# Patient Record
Sex: Male | Born: 2000
Health system: Southern US, Community
[De-identification: ages and names within clinical notes are randomized; demographics above are authoritative.]

## PROBLEM LIST (undated history)

## (undated) DIAGNOSIS — F909 Attention-deficit hyperactivity disorder, unspecified type: Secondary | ICD-10-CM

## (undated) HISTORY — PX: VARICOCELE EXCISION: SUR582

---
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
## 2019-06-07 ENCOUNTER — Encounter (INDEPENDENT_AMBULATORY_CARE_PROVIDER_SITE_OTHER): Payer: Self-pay

## 2019-06-07 ENCOUNTER — Other Ambulatory Visit: Payer: Self-pay

## 2019-06-07 ENCOUNTER — Ambulatory Visit (INDEPENDENT_AMBULATORY_CARE_PROVIDER_SITE_OTHER): Payer: Commercial Managed Care - PPO | Admitting: Family Medicine

## 2019-06-07 ENCOUNTER — Encounter: Payer: Self-pay | Admitting: Family Medicine

## 2019-06-07 DIAGNOSIS — R0789 Other chest pain: Secondary | ICD-10-CM

## 2019-06-07 DIAGNOSIS — R011 Cardiac murmur, unspecified: Secondary | ICD-10-CM

## 2019-06-07 DIAGNOSIS — F900 Attention-deficit hyperactivity disorder, predominantly inattentive type: Secondary | ICD-10-CM | POA: Diagnosis not present

## 2019-06-07 NOTE — Patient Instructions (Addendum)
Let me know if the chest pain returns or gets worse  Also let me know if you decide you would like testing for Sexually Transmitted Infections  Preventive Care for [place], Male The transition to life after high school as a young adult can be a stressful time with many changes. You may start seeing a primary care physician instead of a pediatrician. This is the time when your health care becomes your responsibility. Preventive care refers to lifestyle choices and visits with your health care provider that can promote health and wellness. What does preventive care include?  A yearly physical exam. This is also called an annual wellness visit.  Dental exams once or twice a year.  Routine eye exams. Ask your health care provider how often you should have your eyes checked.  Personal lifestyle choices, including: ? Daily care of your teeth and gums. ? Regular physical activity. ? Eating a healthy diet. ? Avoiding tobacco and drug use. ? Avoiding or limiting alcohol use. ? Practicing safe sex. What happens during an annual wellness visit? Preventive care starts with a yearly visit to your primary care physician. The services and screenings done by your health care provider during your annual wellness visit will depend on your overall health, lifestyle risk factors, and family history of disease. Counseling Your health care provider may ask you questions about:  Past medical problems and your family's medical history.  Medicines or supplements that you take.  Health insurance and access to health care.  Alcohol, tobacco, and drug use, including use of any bodybuilding drugs (anabolic steroids).  Your safety at home, work, or school.  Access to firearms.  Emotional well-being and how you cope with stress.  Relationship well-being.  Diet, exercise, and sleep habits.  Your sexual health and activity. Screening You may have the following tests or measurements:  Height,  weight, and BMI.  Blood pressure.  Lipid and cholesterol levels.  Tuberculosis skin test.  Skin exam.  Vision and hearing tests.  Genital exam to check for testicular cancer or hernias.  Screening test for hepatitis.  Screening tests for STDs (sexually transmitted diseases), if you are at risk. Vaccines Your health care provider may recommend certain vaccines, such as:  Influenza vaccine. This is recommended every year.  Tetanus, diphtheria, and acellular pertussis (Tdap, Td) vaccine. You may need a Td booster every 10 years.  Varicella vaccine. You may need this if you have not been vaccinated.  HPV vaccine. If you are 30 or younger, you may need three doses over 6 months.  Measles, mumps, and rubella (MMR) vaccine. You may need at least one dose of MMR. You may also need a second dose.  Pneumococcal 13-valent conjugate (PCV13) vaccine. You may need this if you have certain conditions and have not been vaccinated.  Pneumococcal polysaccharide (PPSV23) vaccine. You may need one or two doses if you smoke cigarettes or if you have certain conditions.  Meningococcal vaccine. One dose is recommended if you are age 28-21 years and a first-year college student living in a residence hall, or if you have one of several medical conditions. You may also need additional booster doses.  Hepatitis A vaccine. You may need this if you have certain conditions or if you travel or work in places where you may be exposed to hepatitis A.  Hepatitis B vaccine. You may need this if you have certain conditions or if you travel or work in places where you may be exposed to hepatitis B.  Haemophilus influenzae type b (Hib) vaccine. You may need this if you have certain risk factors. Talk to your health care provider about which screenings and vaccines you need and how often you need them. What steps can I take to develop healthy behaviors?      Have regular preventive health care visits with  your primary care physician and dentist.  Eat a healthy diet.  Drink enough fluid to keep your urine clear or pale yellow.  Stay active. Exercise at least 30 minutes 5 or more days of the week.  Use alcohol responsibly.  Maintain a healthy weight.  Do not use any products that contain nicotine, such as cigarettes, chewing tobacco, and e-cigarettes. If you need help quitting, ask your health care provider.  Do not use drugs.  Practice safe sex. This includes using condoms to prevent STDs or an unwanted pregnancy.  Find healthy ways to manage stress. How can I protect myself from injury? Injuries from violence or accidents are the leading cause of death among young adults and can often be prevented. Take these steps to help protect yourself:  Always wear your seat belt while driving or riding in a vehicle.  Do not drive if you have been drinking alcohol. Do not ride with someone who has been drinking.  Do not drive when you are tired or distracted. Do not text while driving.  Wear a helmet and other protective equipment during sports activities.  If you have firearms in your house, make sure you follow all gun safety procedures.  Seek help if you have been bullied, physically abused, or sexually abused.  Avoid fighting.  Use the Internet responsibly to avoid dangers such as online bullying. What can I do to cope with stress? Young adults may face many new challenges that can be stressful, such as finding a job, going to college, moving away from home, managing money, being in a relationship, getting married, and having children. To manage stress:  Avoid known stressful situations when you can.  Exercise regularly.  Find a stress-reducing activity that works best for you. Examples include meditation, yoga, listening to music, or reading.  Spend time in nature.  Keep a journal to write about your stress and how you respond.  Talk to your health care provider about  stress. He or she may suggest counseling.  Spend time with supportive friends or family.  Do not cope with stress by: ? Drinking alcohol or using drugs. ? Smoking cigarettes. ? Eating. Where can I get more information? Learn more about preventive care and healthy habits from:  U.S. Preventive Services Task Force: StageSync.si  National Adolescent and Mikes: StrategicRoad.nl  American Academy of Pediatrics Bright Futures: https://brightfutures.MemberVerification.co.za  Society for Adolescent Health and Medicine: MoralBlog.co.za.aspx  PodExchange.nl: ToyLending.fr This information is not intended to replace advice given to you by your health care provider. Make sure you discuss any questions you have with your health care provider. Document Released: 04/15/2016 Document Revised: 07/12/2017 Document Reviewed: 04/15/2016 Elsevier Interactive Patient Education  2019 [employer].

## 2019-06-07 NOTE — Progress Notes (Signed)
Annual Exam   Chief Complaint:  Chief Complaint  Patient presents with  . Establish Care    previous PCP with [facility]  . Chest Pain    happened on 06/04/2019 had mid chest pain, felt like he was getting punched. In the mornings for 2 days.    History of Present Illness:  Jay Webb is a 18 y.o. presents today for annual examination.     Chest Pain  This is a new problem. The current episode started in the past 7 days. The onset quality is sudden. The problem occurs daily. The problem has been resolved. The pain is present in the substernal region. The pain is at a severity of 7/10. The quality of the pain is described as pressure. The pain does not radiate. Pertinent negatives include no abdominal pain, back pain, cough, diaphoresis, dizziness, exertional chest pressure, fever, headaches, irregular heartbeat, leg pain, malaise/fatigue, nausea, near-syncope, palpitations, shortness of breath, vomiting or weakness. Associated with: cold. Treatments tried: deep breathing. The treatment provided moderate relief. Risk factors include male gender.  His past medical history is significant for anxiety/panic attacks.  His family medical history is significant for hypertension.   Lasted for several hours - would get worse when he went into cold climate No episodes today    Social History   Tobacco Use  Smoking Status Never Smoker  Smokeless Tobacco Never Used   Social History   Substance and Sexual Activity  Alcohol Use Never  . Frequency: Never   Social History   Substance and Sexual Activity  Drug Use Not Currently     General Health Dentist in the last year: Yes Eye doctor: no  Weight Wt Readings from Last 3 Encounters:  06/07/19 174 lb 12 oz (79.3 kg) (80 %, Z= 0.85)*  08/28/18 184 lb 4.9 oz (83.6 kg) (89 %, Z= 1.23)*  05/27/15 134 lb 7.7 oz (61 kg) (73 %, Z= 0.63)*   * Growth percentiles are based on CDC (Boys, 2-20 Years) data.   Patient has normal  BMI  BMI Readings from Last 1 Encounters:  06/07/19 24.37 kg/m (74 %, Z= 0.64)*   * Growth percentiles are based on CDC (Boys, 2-20 Years) data.     Chronic disease screening Blood pressure monitoring:  BP Readings from Last 3 Encounters:  06/07/19 128/80  08/28/18 118/78  05/28/15 130/65    Lipid Monitoring: Indication for screening: age >35, obesity, diabetes, family hx, CV risk factors.  Lipid screening: Not Indicated  Lab Results  Component Value Date   CHOL 143 12/30/2017   HDL 37 12/30/2017   [place] 95 12/30/2017   TRIG 56 12/30/2017     Diabetes Screening: age >73, overweight, family hx, PCOS, hx of gestational diabetes, at risk ethnicity, elevated blood pressure >135/80.  Diabetes Screening screening: Not Indicated  No results found for: HGBA1C   Immunization History  Administered Date(s) Administered  . DTaP 01/09/2001, 03/29/2001, 08/30/2001, 02/14/2002, 02/24/2006  . Hepatitis A 01/02/2009, 11/24/2010  . Hepatitis B 11/17/2000, 12/22/2000, 08/29/2001  . HiB (PRP-OMP) 01/09/2001, 03/29/2001, 08/30/2001, 02/14/2002  . Hpv 12/30/2017, 03/01/2018  . IPV 01/09/2001, 03/29/2001, 02/14/2002, 02/24/2006  . MMR 11/06/2001, 02/24/2006  . Meningococcal B Recombinant 12/30/2017, 02/01/2018  . Meningococcal Conjugate 02/15/2014  . Meningococcal Mcv4o 11/16/2016  . Tdap 11/29/2011  . Varicella 11/06/2001, 09/03/2015    Past Medical History:  Diagnosis Date  . ADHD 2014    Past Surgical History:  Procedure Laterality Date  . VARICOCELE EXCISION  Prior to Admission medications   Not on File    Allergies  Allergen Reactions  . Other Itching    Seasonal, cats     Social History   Socioeconomic History  . Marital status: Single    Spouse name: Not on file  . Number of children: Not on file  . Years of education: High school  . Highest education level: Not on file  Occupational History  . Not on file  Social Needs  . Financial resource  strain: Not hard at all  . Food insecurity    Worry: Not on file    Inability: Not on file  . Transportation needs    Medical: Not on file    Non-medical: Not on file  Tobacco Use  . Smoking status: Never Smoker  . Smokeless tobacco: Never Used  Substance and Sexual Activity  . Alcohol use: Never    Frequency: Never  . Drug use: Not Currently  . Sexual activity: Not Currently  Lifestyle  . Physical activity    Days per week: Not on file    Minutes per session: Not on file  . Stress: Not on file  Relationships  . Social Herbalist on phone: Not on file    Gets together: Not on file    Attends religious service: Not on file    Active member of club or organization: Not on file    Attends meetings of clubs or organizations: Not on file    Relationship status: Not on file  . Intimate partner violence    Fear of current or ex partner: Not on file    Emotionally abused: Not on file    Physically abused: Not on file    Forced sexual activity: Not on file  Other Topics Concern  . Not on file  Social History Narrative   06/07/19   From: [place], moved her when he was 18 years old   Living: with Dad   Work: works in [place]      Family: close with family, sister lives with mom      Enjoys: any sport - soccer and basketball, spending time with friends, working      Exercise: running and working out at home   Diet: fast food breakfast, eats fruits/veggies, regular dinner      Safety   Seat belts: Yes    Guns: No   Safe in relationships: Yes     Family History  Problem Relation Age of Onset  . Arthritis Mother   . Hypertension Mother   . Thyroid disease Mother   . Stroke Maternal Grandfather 68    Review of Systems  Constitutional: Negative for diaphoresis, fever and malaise/fatigue.  HENT: Negative for congestion and sinus pain.   Eyes: Negative for blurred vision and double vision.  Respiratory: Negative for cough and shortness of breath.    Cardiovascular: Positive for chest pain. Negative for palpitations and near-syncope.  Gastrointestinal: Negative for abdominal pain, nausea and vomiting.  Genitourinary: Negative for dysuria.  Musculoskeletal: Negative for back pain.  Skin: Negative for rash.  Neurological: Negative for dizziness, weakness and headaches.  Endo/Heme/Allergies: Negative for environmental allergies.  Psychiatric/Behavioral: Negative for depression. The patient is not nervous/anxious.      Physical Exam BP 128/80   Pulse 72   Temp 98 F (36.7 C)   Ht 5' 11" (1.803 m)   Wt 174 lb 12 oz (79.3 kg)   SpO2 96%  BMI 24.37 kg/m    BP Readings from Last 3 Encounters:  06/07/19 128/80  08/28/18 118/78  05/28/15 130/65      Physical Exam Constitutional:      General: He is not in acute distress.    Appearance: He is well-developed. He is not diaphoretic.  HENT:     Head: Normocephalic and atraumatic.     Right Ear: Tympanic membrane and ear canal normal.     Left Ear: Tympanic membrane and ear canal normal.     Nose: Nose normal.     Mouth/Throat:     Pharynx: Uvula midline.  Eyes:     General: No scleral icterus.    Conjunctiva/sclera: Conjunctivae normal.     Pupils: Pupils are equal, round, and reactive to light.  Neck:     Musculoskeletal: Normal range of motion and neck supple.  Cardiovascular:     Rate and Rhythm: Normal rate and regular rhythm.     Heart sounds: Normal heart sounds. No murmur.     Comments: No murmur with valsva Pulmonary:     Effort: Pulmonary effort is normal. No respiratory distress.     Breath sounds: Normal breath sounds. No wheezing.  Abdominal:     General: Bowel sounds are normal. There is no distension.     Palpations: Abdomen is soft. There is no mass.     Tenderness: There is no abdominal tenderness. There is no guarding.  Musculoskeletal: Normal range of motion.  Lymphadenopathy:     Cervical: No cervical adenopathy.  Skin:    General: Skin is warm  and dry.     Capillary Refill: Capillary refill takes less than 2 seconds.  Neurological:     Mental Status: He is alert and oriented to person, place, and time.        Results: AUDIT Questionnaire (screen for alcoholism): does not drink  PHQ-9:  low risk     Assessment: 18 y.o. here for routine annual physical examination.  Plan: Problem List Items Addressed This Visit      Other   ADHD (attention deficit hyperactivity disorder), inattentive type    No longer needing medication.       Heart murmur   Atypical chest pain    Normal exam and symptoms have resolved. Unclear what caused the symptoms - given improvement with deep breathing wonder about some anxiety component but pt denied symptoms. Consider cardiac monitoring if returning or persisting to r/o cardiac cause - but no exertional and improved with activity makes this less likely         Screening: -- Blood pressure screen normal -- cholesterol screening: not due for screening -- Weight screening: normal -- Diabetes Screening: not due for screening -- Nutrition: normal    Psych -- Depression screening (PHQ-9): low risk  Safety -- tobacco screening: not using -- alcohol screening: low-risk usage. -- no evidence of domestic violence or intimate partner violence. -- STD screening: gonorrhea/chlamydia NAAT not collected per patient request. -----he will contact if he decides to pursue STI testing   Cancer Screening -- No age related cancer screening due  Immunizations -- flu vaccine up to date -- TDAP q10 years up to date      Lesleigh Noe

## 2019-06-07 NOTE — Assessment & Plan Note (Signed)
Normal exam and symptoms have resolved. Unclear what caused the symptoms - given improvement with deep breathing wonder about some anxiety component but pt denied symptoms. Consider cardiac monitoring if returning or persisting to r/o cardiac cause - but no exertional and improved with activity makes this less likely

## 2019-06-07 NOTE — Assessment & Plan Note (Signed)
No longer needing medication.

## 2019-08-06 ENCOUNTER — Other Ambulatory Visit: Payer: Self-pay

## 2019-08-06 ENCOUNTER — Ambulatory Visit: Payer: Commercial Managed Care - PPO | Admitting: Family Medicine

## 2019-08-06 ENCOUNTER — Encounter: Payer: Self-pay | Admitting: Family Medicine

## 2019-08-06 VITALS — BP 108/62 | HR 68 | Temp 98.1°F | Wt 181.0 lb

## 2019-08-06 DIAGNOSIS — Z113 Encounter for screening for infections with a predominantly sexual mode of transmission: Secondary | ICD-10-CM

## 2019-08-06 NOTE — Progress Notes (Signed)
   Subjective:    Patient ID: Jay Webb, male    DOB: 06-14-2001, 18 y.o.   MRN: [id number]  HPI Chief Complaint  Patient presents with  . STD Screening    Pt requesting STD testing/labs   This is an 18 yo male who requests labs for std testing. Denies concerns, has had 5 lifetime partners, heterosexual. Uses condoms some of the time. He denies dysuria, hematuria, testicular pain or swelling. He notes that had has had some bumps on his penis. Had similar bumps on his arms that resolved. Not blisters. Not painful, a little itching. ? Heat rash.   Past Medical History:  Diagnosis Date  . ADHD 2014   Past Surgical History:  Procedure Laterality Date  . VARICOCELE EXCISION     Family History  Problem Relation Age of Onset  . Arthritis Mother   . Hypertension Mother   . Thyroid disease Mother   . Stroke Maternal Grandfather 68   Social History   Tobacco Use  . Smoking status: Never Smoker  . Smokeless tobacco: Never Used  Substance Use Topics  . Alcohol use: Never    Frequency: Never  . Drug use: Not Currently      Review of Systems Per HPI    Objective:   Physical Exam Vitals signs reviewed.  Constitutional:      General: He is not in acute distress.    Appearance: Normal appearance. He is normal weight. He is not ill-appearing, toxic-appearing or diaphoretic.  HENT:     Head: Normocephalic and atraumatic.  Eyes:     Conjunctiva/sclera: Conjunctivae normal.  Neck:     Musculoskeletal: Normal range of motion and neck supple.  Cardiovascular:     Rate and Rhythm: Normal rate.  Pulmonary:     Effort: Pulmonary effort is normal.  Genitourinary:    Penis: Normal.      Scrotum/Testes: Normal.     Comments: No skin lesions observed.  Skin:    General: Skin is warm and dry.  Neurological:     Mental Status: He is alert and oriented to person, place, and time.  Psychiatric:        Mood and Affect: Mood normal.        Behavior: Behavior normal.      Thought Content: Thought content normal.        Judgment: Judgment normal.       BP 108/62 (BP Location: Left Arm, Patient Position: Sitting, Cuff Size: Normal)   Pulse 68   Temp 98.1 F (36.7 C) (Temporal)   Wt 181 lb (82.1 kg)   SpO2 98%   BMI 25.24 kg/m  Wt Readings from Last 3 Encounters:  08/06/19 181 lb (82.1 kg) (84 %, Z= 1.01)*  06/07/19 174 lb 12 oz (79.3 kg) (80 %, Z= 0.85)*  08/28/18 184 lb 4.9 oz (83.6 kg) (89 %, Z= 1.23)*   * Growth percentiles are based on CDC (Boys, 2-20 Years) data.       Assessment & Plan:  1. Screening for STD (sexually transmitted disease) - encouraged him to always practice safe sex - C. trachomatis/N. gonorrhoeae RNA - RPR - HIV Antibody (routine testing w rflx)   Clarene Reamer, FNP-BC  [facility]  08/08/2019 8:06 AM

## 2019-08-06 NOTE — Patient Instructions (Signed)
Good to see you today  Please stop at the lab  Use condoms every time!!

## 2019-08-07 LAB — C. TRACHOMATIS/N. GONORRHOEAE RNA
C. trachomatis RNA, TMA: NOT DETECTED
N. gonorrhoeae RNA, TMA: NOT DETECTED

## 2019-08-07 LAB — RPR: RPR Ser Ql: NONREACTIVE

## 2019-08-07 LAB — HIV ANTIBODY (ROUTINE TESTING W REFLEX): HIV 1&2 Ab, 4th Generation: NONREACTIVE

## 2019-08-08 ENCOUNTER — Encounter: Payer: Self-pay | Admitting: Family Medicine

## 2019-08-09 ENCOUNTER — Telehealth: Payer: Self-pay | Admitting: Family Medicine

## 2019-08-09 NOTE — Telephone Encounter (Signed)
Patient is returning your call in regards to his lab results.  C/B # [phone]

## 2019-08-09 NOTE — Telephone Encounter (Signed)
See result note.  

## 2019-09-03 ENCOUNTER — Other Ambulatory Visit: Payer: Self-pay

## 2019-09-03 ENCOUNTER — Telehealth: Payer: Self-pay | Admitting: Family Medicine

## 2019-09-03 DIAGNOSIS — Z20822 Contact with and (suspected) exposure to covid-19: Secondary | ICD-10-CM

## 2019-09-03 NOTE — Telephone Encounter (Signed)
Left message for patient to call back  

## 2019-09-03 NOTE — Telephone Encounter (Signed)
Noted. Please call patient and see if he would like a virtual visit.

## 2019-09-03 NOTE — Telephone Encounter (Signed)
Order is already in [place] placed by [facility] nurse pool.

## 2019-09-03 NOTE — Telephone Encounter (Signed)
Pt wanted to wait for test results

## 2019-09-03 NOTE — Telephone Encounter (Signed)
Patient called and stated that he has be sick and not feeling well. He is wanting to be COVID tested and will be going to [facility] site to be tested.

## 2019-09-05 LAB — NOVEL CORONAVIRUS, NAA: SARS-CoV-2, NAA: NOT DETECTED

## 2019-09-06 ENCOUNTER — Telehealth: Payer: Self-pay | Admitting: Family Medicine

## 2019-09-06 NOTE — Telephone Encounter (Signed)
I will see him then  Cc to [facility]

## 2019-09-06 NOTE — Telephone Encounter (Signed)
Best number [phone] Pt called stating he tested for covid on 9/21 and received results  neg 9/23.  He needs note to go back to work  Pt was out of work 9/16 to 9/21  Returned to on 9/22

## 2019-09-06 NOTE — Telephone Encounter (Signed)
Patient advised. Patient has a cough so virtual visit is set up with Dr Glori Bickers.

## 2019-09-06 NOTE — Telephone Encounter (Signed)
Please review. Patient did not have a visit for this.

## 2019-09-06 NOTE — Telephone Encounter (Signed)
Please notify the patient that we need additional information and will need to meet with him for the work note.  We can meet virtually if that is more convenient.

## 2019-09-07 ENCOUNTER — Encounter: Payer: Self-pay | Admitting: Family Medicine

## 2019-09-07 ENCOUNTER — Ambulatory Visit (INDEPENDENT_AMBULATORY_CARE_PROVIDER_SITE_OTHER): Payer: Commercial Managed Care - PPO | Admitting: Family Medicine

## 2019-09-07 DIAGNOSIS — B9789 Other viral agents as the cause of diseases classified elsewhere: Secondary | ICD-10-CM | POA: Diagnosis not present

## 2019-09-07 DIAGNOSIS — J069 Acute upper respiratory infection, unspecified: Secondary | ICD-10-CM

## 2019-09-07 NOTE — Patient Instructions (Signed)
So glad you are feeling better.  You can return to work without restriction tomorrow. I sent you the note via mychart but call us if you need anything else. If symptoms return or new ones develop please let me know

## 2019-09-07 NOTE — Progress Notes (Signed)
Virtual Visit via Video Note  I connected with Lamar Benes on 09/07/19 at 10:00 AM EDT by a video enabled telemedicine application and verified that I am speaking with the correct person using two identifiers.  Location: Patient: home Provider: office    I discussed the limitations of evaluation and management by telemedicine and the availability of in person appointments. The patient expressed understanding and agreed to proceed.  History of Present Illness: Pt presents for f/u of illness  Last Wednesday he got really sick and had to be out of work  Was out for 4 days  Had a low grade fever (100.5) with uri symptoms   Tested for corona virus on Monday  Negative   To get back to work - needs note  Works at [employer] better - most symptoms are gone  Cough persists- mild / not productive  No wheeze or sob  No nasal symptoms or ear/throat pain  Taste and smell nl  No GI symptoms   Usually works m-sat   Patient Active Problem List   Diagnosis Date Noted  . Viral URI with cough 09/07/2019  . ADHD (attention deficit hyperactivity disorder), inattentive type 06/07/2019  . Heart murmur 06/07/2019  . Atypical chest pain 06/07/2019   Past Medical History:  Diagnosis Date  . ADHD 2014   Past Surgical History:  Procedure Laterality Date  . VARICOCELE EXCISION     Social History   Tobacco Use  . Smoking status: Never Smoker  . Smokeless tobacco: Never Used  Substance Use Topics  . Alcohol use: Not Currently    Frequency: Never  . Drug use: Not Currently   Family History  Problem Relation Age of Onset  . Arthritis Mother   . Hypertension Mother   . Thyroid disease Mother   . Stroke Maternal Grandfather 68   Allergies  Allergen Reactions  . Other Itching    Seasonal, cats   No current outpatient medications on file prior to visit.   No current facility-administered medications on file prior to visit.    Review of Systems  Constitutional: Negative  for chills, fever and malaise/fatigue.  HENT: Negative for congestion, ear pain, sinus pain and sore throat.   Eyes: Negative for blurred vision, pain, discharge and redness.  Respiratory: Positive for cough.        Mild residual cough  Cardiovascular: Negative for chest pain and palpitations.  Gastrointestinal: Negative for abdominal pain, diarrhea, nausea and vomiting.  Skin: Negative for rash.  Neurological: Negative for dizziness and headaches.      Observations/Objective: Patient appears well, in no distress Weight is baseline  No facial swelling or asymmetry Normal voice-not hoarse and no slurred speech No obvious tremor or mobility impairment Moving neck and UEs normally Able to hear the call well  No cough or shortness of breath during interview  Talkative and mentally sharp with no cognitive changes No skin changes on face or neck , no rash or pallor Affect is normal    Assessment and Plan: Problem List Items Addressed This Visit      Respiratory   Viral URI with cough    Much improved with neg corona virus test on Monday Note written to return to work tomorrow inst to alert [place] if symptoms return or new ones develop          Follow Up Instructions: So glad you are feeling better.  You can return to work without restriction tomorrow. I sent you the note  via mychart but call us if you need anything else. If symptoms return or new ones develop please let me know    I discussed the assessment and treatment plan with the patient. The patient was provided an opportunity to ask questions and all were answered. The patient agreed with the plan and demonstrated an understanding of the instructions.   The patient was advised to call back or seek an in-person evaluation if the symptoms worsen or if the condition fails to improve as anticipated.     Loura Pardon, MD

## 2019-09-08 NOTE — Assessment & Plan Note (Signed)
Much improved with neg corona virus test on Monday Note written to return to work tomorrow inst to alert [place] if symptoms return or new ones develop

## 2019-09-10 ENCOUNTER — Ambulatory Visit: Payer: Commercial Managed Care - PPO | Admitting: Family Medicine

## 2019-10-04 ENCOUNTER — Telehealth: Payer: Self-pay | Admitting: Radiology

## 2019-10-04 NOTE — Telephone Encounter (Signed)
Called the patient to confirm address and phone number for [place] lab. LVM

## 2020-03-28 ENCOUNTER — Other Ambulatory Visit: Payer: Self-pay

## 2020-03-28 ENCOUNTER — Encounter: Payer: Self-pay | Admitting: Emergency Medicine

## 2020-03-28 ENCOUNTER — Emergency Department: Payer: Commercial Managed Care - PPO

## 2020-03-28 ENCOUNTER — Telehealth: Payer: Self-pay

## 2020-03-28 ENCOUNTER — Emergency Department
Admission: EM | Admit: 2020-03-28 | Discharge: 2020-03-28 | Disposition: A | Discharge status: Home / Self Care | Payer: Commercial Managed Care - PPO | Attending: Emergency Medicine | Admitting: Emergency Medicine

## 2020-03-28 DIAGNOSIS — R002 Palpitations: Secondary | ICD-10-CM | POA: Diagnosis present

## 2020-03-28 LAB — CBC
HCT: 44.3 % (ref 39.0–52.0)
Hemoglobin: 15.6 g/dL (ref 13.0–17.0)
MCH: 31.4 pg (ref 26.0–34.0)
MCHC: 35.2 g/dL (ref 30.0–36.0)
MCV: 89.1 fL (ref 80.0–100.0)
Platelets: 316 10*3/uL (ref 150–400)
RBC: 4.97 MIL/uL (ref 4.22–5.81)
RDW: 11.9 % (ref 11.5–15.5)
WBC: 6.8 10*3/uL (ref 4.0–10.5)
nRBC: 0 % (ref 0.0–0.2)

## 2020-03-28 LAB — BASIC METABOLIC PANEL
Anion gap: 9 (ref 5–15)
BUN: 12 mg/dL (ref 6–20)
CO2: 24 mmol/L (ref 22–32)
Calcium: 9.6 mg/dL (ref 8.9–10.3)
Chloride: 105 mmol/L (ref 98–111)
Creatinine, Ser: 0.69 mg/dL (ref 0.61–1.24)
GFR calc Af Amer: >60 mL/min (ref >60)
GFR calc non Af Amer: >60 mL/min (ref >60)
Glucose, Bld: 103 mg/dL — ABNORMAL HIGH (ref 70–99)
Potassium: 4 mmol/L (ref 3.5–5.1)
Sodium: 138 mmol/L (ref 135–145)

## 2020-03-28 MED ORDER — SODIUM CHLORIDE 0.9% FLUSH: 3.0000 mL | Freq: Once | INTRAVENOUS | Status: DC

## 2020-03-28 NOTE — Telephone Encounter (Signed)
Agree with plan for evaluation

## 2020-03-28 NOTE — Telephone Encounter (Signed)
pts mom and pt on phone; earlier this morning pt had heart flutters which is continuing; heart racing; not sure heart rate but pt said heart was beating fast like he was working out. Pt was lightheaded, felt weak and felt like was going to pass out; pt did not lose consciousness but pt felt like someone was holding him down. Pt still having heart flutters but since resting other symptoms have subsided; pt had similar episode few wks ago but symptoms today were more intense. pts mom will take pt to [facility] ED now for eval and possible testing. FYI to Dr Selena Batten.

## 2020-03-28 NOTE — ED Notes (Signed)
EKG repeated per MD request, VS updated.

## 2020-03-28 NOTE — Discharge Instructions (Addendum)
Your EKG did look slightly abnormal but stable from 3 years ago.  You should follow-up with the cardiology team for discussion of Holter monitor.  Return to the ER if you lose consciousness or any other concerns try to cut down your caffeine and avoid exertion until you follow-up

## 2020-03-28 NOTE — ED Triage Notes (Signed)
Pt to ED via POV c/o feeling as if his heart was racing this morning while he was at work. Pt states that he also had the feeling that he was going to pass out. Pt states that this was around 0630 this morning. Pt reports that he is feeling better now but when he gets more active he starts having the same feeling again. Pt states that he has similar episode a few weeks ago. Pt states that the episode this morning lasted about 1.5 hours. Pt is in NAD at this time.

## 2020-03-28 NOTE — ED Provider Notes (Signed)
[facility] Emergency Department Provider Note  ____________________________________________   First MD Initiated Contact with Patient 03/28/20 1651     (approximate)  I have reviewed the triage vital signs and the nursing notes.   HISTORY  Chief Complaint Palpitations    HPI Jay Webb is a 19 y.o. male with ADHD who comes in with concerns for palpitations.  Patient states that he had intermittent palpitations for a while now but today he was exerting himself and noted palpitations that were more constant last about an hour, got better on their own, nothing particular brought it on.  Patient stated he felt like he was about to pass out but he actually did not lose any consciousness.  Denies any leg swelling, shortness of breath.  Patient states that he continues to have these intermittent palpitations throughout the day that are less severe.  Patient reports having a history of a murmur when he was a child and then having an abnormal EKG 3 years ago which she had followed up with cardiology and had a normal echocardiogram and was just told that he had an abnormal rhythm on his EKG and that since he was asymptomatic they did not need to do anything about it.  He had not follow-up with cardiology in 3 years.  I did review patient's prior records and he saw a Dr. Raul Del a pediatric cardiologist and had normal echocardiogram.    Past Medical History:  Diagnosis Date  . ADHD 2014    Patient Active Problem List   Diagnosis Date Noted  . Viral URI with cough 09/07/2019  . ADHD (attention deficit hyperactivity disorder), inattentive type 06/07/2019  . Heart murmur 06/07/2019  . Atypical chest pain 06/07/2019    Past Surgical History:  Procedure Laterality Date  . VARICOCELE EXCISION      Prior to Admission medications   Not on File    Allergies Other  Family History  Problem Relation Age of Onset  . Arthritis Mother   . Hypertension  Mother   . Thyroid disease Mother   . Stroke Maternal Grandfather 68    Social History Social History   Tobacco Use  . Smoking status: Never Smoker  . Smokeless tobacco: Never Used  Substance Use Topics  . Alcohol use: Not Currently  . Drug use: Not Currently      Review of Systems Constitutional: No fever/chills Eyes: No visual changes. ENT: No sore throat. Cardiovascular: Denies chest pain.  Positive palpitations Respiratory: Denies shortness of breath. Gastrointestinal: No abdominal pain.  No nausea, no vomiting.  No diarrhea.  No constipation. Genitourinary: Negative for dysuria. Musculoskeletal: Negative for back pain. Skin: Negative for rash. Neurological: Negative for headaches, focal weakness or numbness. All other ROS negative ____________________________________________   PHYSICAL EXAM:  VITAL SIGNS: ED Triage Vitals  Enc Vitals Group     BP 03/28/20 1257 122/77     Pulse Rate 03/28/20 1257 62     Resp 03/28/20 1257 16     Temp 03/28/20 1257 98.5 F (36.9 C)     Temp Source 03/28/20 1257 Oral     SpO2 03/28/20 1257 98 %     Weight 03/28/20 1255 190 lb (86.2 kg)     Height 03/28/20 1255 6' (1.829 m)     Head Circumference --      Peak Flow --      Pain Score 03/28/20 1254 0     Pain Loc --      Pain Edu? --  Excl. in GC? --     Constitutional: Alert and oriented. Well appearing and in no acute distress. Eyes: Conjunctivae are normal. EOMI. Head: Atraumatic. Nose: No congestion/rhinnorhea. Mouth/Throat: Mucous membranes are moist.   Neck: No stridor. Trachea Midline. FROM Cardiovascular: Normal rate, regular rhythm. Grossly normal heart sounds.  Good peripheral circulation. Respiratory: Normal respiratory effort.  No retractions. Lungs CTAB. Gastrointestinal: Soft and nontender. No distention. No abdominal bruits.  Musculoskeletal: No lower extremity tenderness nor edema.  No joint effusions. Neurologic:  Normal speech and language. No  gross focal neurologic deficits are appreciated.  Skin:  Skin is warm, dry and intact. No rash noted. Psychiatric: Mood and affect are normal. Speech and behavior are normal. GU: Deferred   ____________________________________________   LABS (all labs ordered are listed, but only abnormal results are displayed)  Labs Reviewed  BASIC METABOLIC PANEL - Abnormal; Notable for the following components:      Result Value   Glucose, Bld 103 (*)    All other components within normal limits  CBC   ____________________________________________   ED ECG REPORT I, Concha Se, the attending physician, personally viewed and interpreted this ECG.  EKG is possibly a ectopic atrial focus and bradycardic at the rate of 56 with no ST elevation, no T wave inversions, short PR interval and right bundle branch block  Repeat EKG is the same with ectopic atrial focus and bradycardic without any T wave inversions and short PR right bundle branch block  ____________________________________________  RADIOLOGY I, Concha Se, personally viewed and evaluated these images (plain radiographs) as part of my medical decision making, as well as reviewing the written report by the radiologist.  ED MD interpretation: No pneumonia  Official radiology report(s): DG Chest 2 View  Result Date: 03/28/2020 CLINICAL DATA:  Palpitations. EXAM: CHEST - 2 VIEW COMPARISON:  08/28/2018 FINDINGS: The heart size and mediastinal contours are within normal limits. Both lungs are clear. The visualized skeletal structures are unremarkable. IMPRESSION: No active cardiopulmonary disease. Electronically Signed   By: Elberta Fortis M.D.   On: 03/28/2020 13:33    ____________________________________________   PROCEDURES  Procedure(s) performed (including Critical Care):  Procedures   ____________________________________________   INITIAL IMPRESSION / ASSESSMENT AND PLAN / ED COURSE  Jay Webb was evaluated in  Emergency Department on 03/28/2020 for the symptoms described in the history of present illness. He was evaluated in the context of the global COVID-19 pandemic, which necessitated consideration that the patient might be at risk for infection with the SARS-CoV-2 virus that causes COVID-19. Institutional protocols and algorithms that pertain to the evaluation of patients at risk for COVID-19 are in a state of rapid change based on information released by regulatory bodies including the CDC and federal and state organizations. These policies and algorithms were followed during the patient's care in the ED.    Patient is a 19 year old who comes in with intermittent palpitations.  EKG does look abnormal but he had an EKG 3 years ago and it looks stable from that.  Patient already had an echocardiogram that was reassuring.  Will get labs to evaluate for electrolyte abnormalities, AKI.  No shortness of breath is high 100% I will suspicion for PE.  No symptoms of thyroid issues.  Patient does report drinking a lot more caffeine recently so we discussed cutting down on that.  Labs are reassuring  Discussed with Dr. Juliann Pares on patient's abnormal EKG who thought that the inverted P wave suggest an AV nodal trigger  and that he did not think it looked consistent with Brugada.  He recommended patient be evaluated outpatient.  Discussed with family about following up with cardiology outpatient.  Most likely will need Holter monitor.  They understand that if he actually loses consciousness he should return the ER immediately but otherwise they can call cardiology on Monday to set up an appointment.  They feel comfortable with this plan.  We discussed avoiding exertion until that he gets follow-up  I discussed the provisional nature of ED diagnosis, the treatment so far, the ongoing plan of care, follow up appointments and return precautions with the patient and any family or support people present. They expressed  understanding and agreed with the plan, discharged home.           ____________________________________________   FINAL CLINICAL IMPRESSION(S) / ED DIAGNOSES   Final diagnoses:  Palpitations      MEDICATIONS GIVEN DURING THIS VISIT:  Medications  sodium chloride flush (NS) 0.9 % injection 3 mL (3 mLs Intravenous Not Given 03/28/20 1719)     ED Discharge Orders    None       Note:  This document was prepared using Dragon voice recognition software and may include unintentional dictation errors.   Concha Se, MD 03/28/20 1726

## 2020-06-19 IMAGING — CR DG CHEST 2V
1 series · 2 of 2 positions shown · non-contrast
Comparison: 08/28/2018

CLINICAL DATA: Palpitations.

EXAM:
CHEST - 2 VIEW

[Series 1: dg chest 2 view · 0.14mm/px · 2 of 2 slices shown]
[im 1/2]
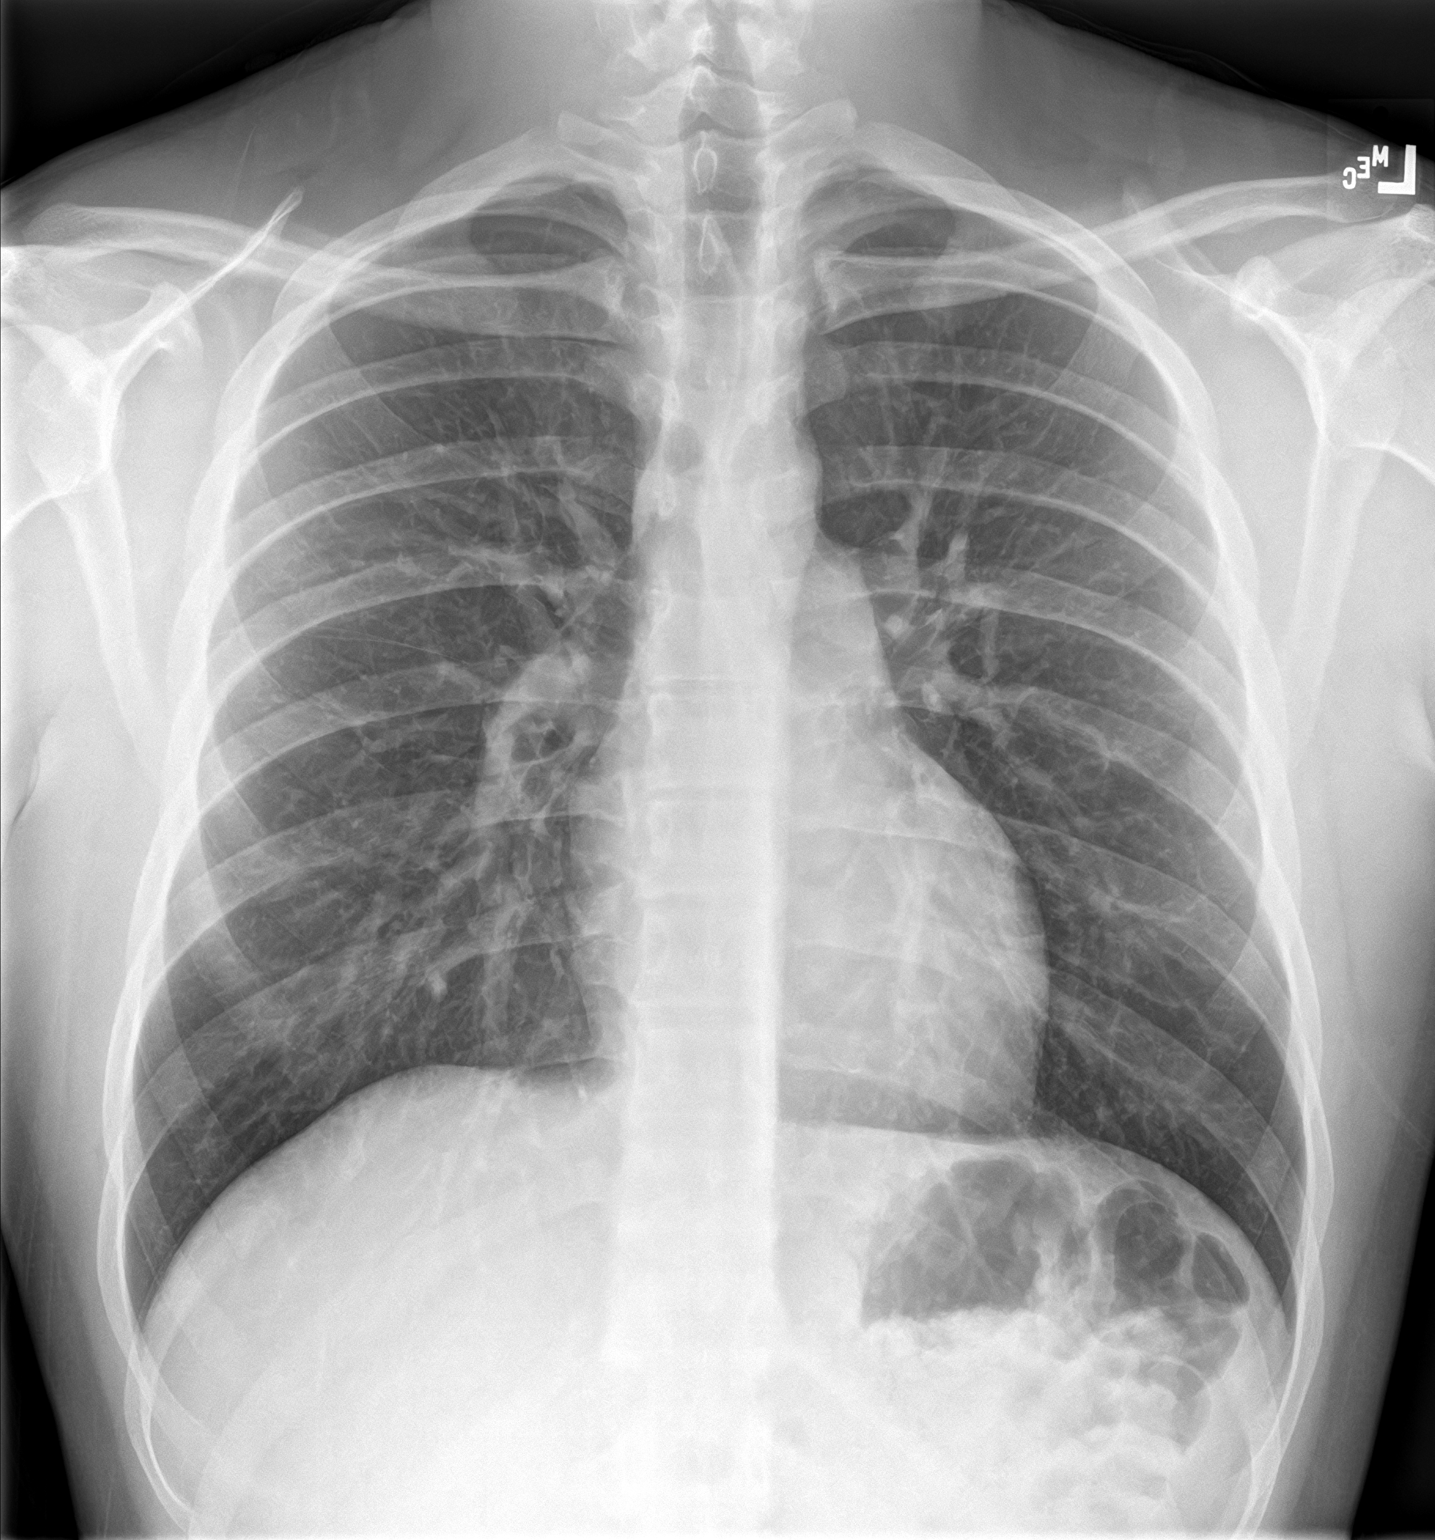
[im 2/2]
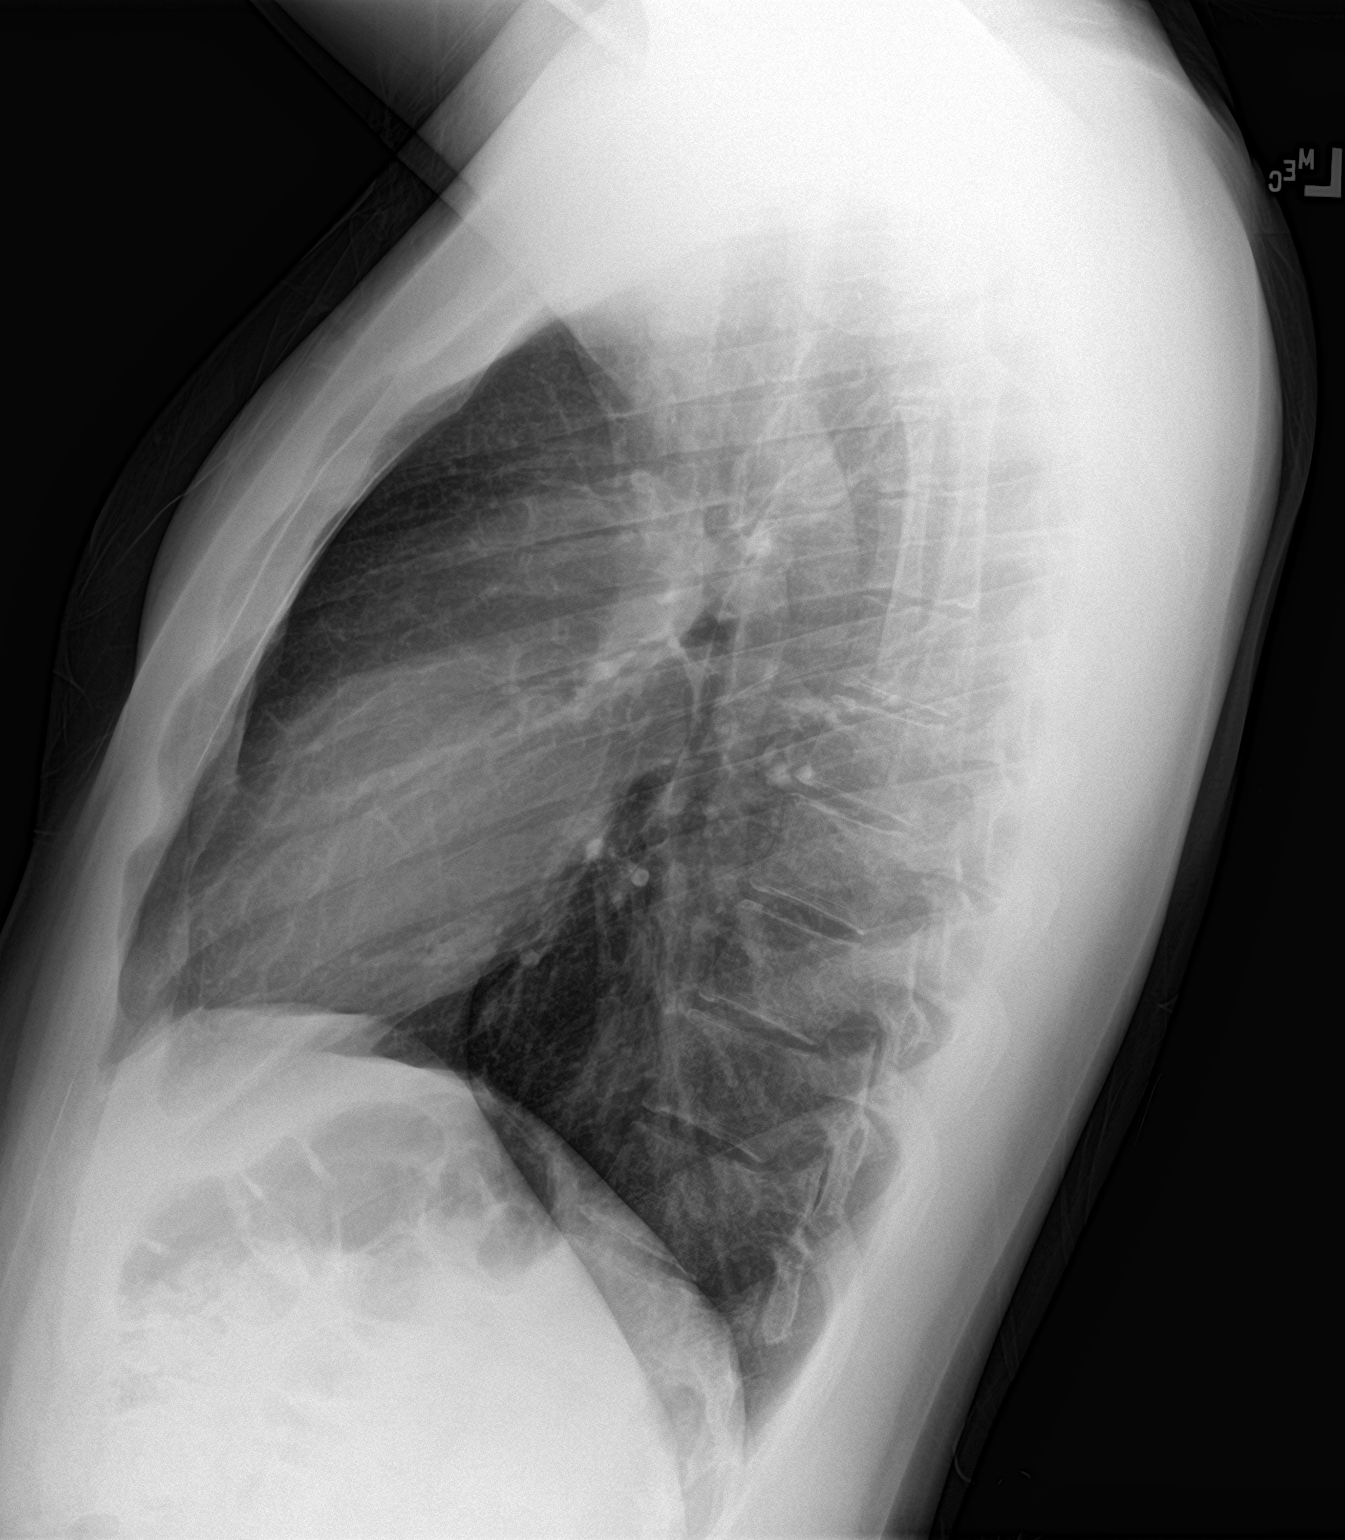

[2 of 2 positions shown; findings below may reference images not displayed]

FINDINGS: The heart size and mediastinal contours are within normal limits.
Both lungs are clear. The visualized skeletal structures are
unremarkable.
IMPRESSION: No active cardiopulmonary disease.

## 2020-07-29 ENCOUNTER — Other Ambulatory Visit: Payer: Self-pay

## 2020-07-29 ENCOUNTER — Ambulatory Visit: Payer: Commercial Managed Care - PPO | Admitting: Family Medicine

## 2020-07-29 ENCOUNTER — Encounter: Payer: Self-pay | Admitting: Family Medicine

## 2020-07-29 ENCOUNTER — Encounter: Payer: Self-pay | Admitting: *Deleted

## 2020-07-29 VITALS — BP 106/70 | HR 92 | Temp 97.6°F | Ht 72.0 in | Wt 190.2 lb

## 2020-07-29 DIAGNOSIS — L0591 Pilonidal cyst without abscess: Secondary | ICD-10-CM | POA: Diagnosis not present

## 2020-07-29 MED ORDER — AMOXICILLIN-POT CLAVULANATE 875-125 MG PO TABS: 1.0000 | ORAL_TABLET | Freq: Two times a day (BID) | ORAL | 0 refills | Status: DC

## 2020-07-29 NOTE — Patient Instructions (Addendum)
We'll call about seeing surgery.  Warm soaks in the meantime.  Start augmentin.  If you have a fever then update [place].  Start ibuprofen 600mg  3 times a day in the meantime with food.  Take care.  Glad to see you.

## 2020-07-29 NOTE — Progress Notes (Signed)
This visit occurred during the SARS-CoV-2 public health emergency.  Safety protocols were in place, including screening questions prior to the visit, additional usage of staff PPE, and extensive cleaning of exam room while observing appropriate contact time as indicated for disinfecting solutions.  Sore spot near tailbone.  Pain started about 4 days ago.  Couldn't lay on the area.  Draining white discharge prev.  No fevers.  No sx like this prev.    Meds, vitals, and allergies reviewed.   ROS: Per HPI unless specifically indicated in ROS section   nad ncat rrr ctab abd soft, not ttp.  Gluteal crease w/o fluctuant mass but midline irritation with mult small openings not currently draining.

## 2020-07-30 DIAGNOSIS — L0591 Pilonidal cyst without abscess: Secondary | ICD-10-CM | POA: Insufficient documentation

## 2020-07-30 NOTE — Assessment & Plan Note (Signed)
Concern for pilonidal cyst.  Anatomy discussed with patient.  We called over to the surgery clinic before the patient left the office to get his referral started. Warm soaks in the meantime.  Start augmentin.  If he has a fever then update [place].  Start ibuprofen 600mg  3 times a day in the meantime with food for pain.  Cautions given to patient.  He agrees with plan.

## 2020-12-03 ENCOUNTER — Other Ambulatory Visit: Payer: Self-pay

## 2020-12-03 ENCOUNTER — Ambulatory Visit
Admission: EM | Admit: 2020-12-03 | Discharge: 2020-12-03 | Disposition: A | Discharge status: Home / Self Care | Payer: Commercial Managed Care - PPO | Attending: Sports Medicine | Admitting: Sports Medicine

## 2020-12-03 ENCOUNTER — Telehealth: Payer: Self-pay | Admitting: *Deleted

## 2020-12-03 ENCOUNTER — Telehealth: Payer: Self-pay

## 2020-12-03 ENCOUNTER — Encounter: Payer: Self-pay | Admitting: Emergency Medicine

## 2020-12-03 DIAGNOSIS — R42 Dizziness and giddiness: Secondary | ICD-10-CM | POA: Diagnosis not present

## 2020-12-03 DIAGNOSIS — R0602 Shortness of breath: Secondary | ICD-10-CM | POA: Diagnosis not present

## 2020-12-03 DIAGNOSIS — I4510 Unspecified right bundle-branch block: Secondary | ICD-10-CM | POA: Diagnosis not present

## 2020-12-03 DIAGNOSIS — Z20822 Contact with and (suspected) exposure to covid-19: Secondary | ICD-10-CM | POA: Diagnosis not present

## 2020-12-03 LAB — CBC WITH DIFFERENTIAL/PLATELET
Abs Immature Granulocytes: 0.04 10*3/uL (ref 0.00–0.07)
Basophils Absolute: 0 10*3/uL (ref 0.0–0.1)
Basophils Relative: 0 %
Eosinophils Absolute: 0 10*3/uL (ref 0.0–0.5)
Eosinophils Relative: 1 %
HCT: 42 % (ref 39.0–52.0)
Hemoglobin: 14.8 g/dL (ref 13.0–17.0)
Immature Granulocytes: 1 %
Lymphocytes Relative: 12 %
Lymphs Abs: 1 10*3/uL (ref 0.7–4.0)
MCH: 31.4 pg (ref 26.0–34.0)
MCHC: 35.2 g/dL (ref 30.0–36.0)
MCV: 89 fL (ref 80.0–100.0)
Monocytes Absolute: 0.6 10*3/uL (ref 0.1–1.0)
Monocytes Relative: 7 %
Neutro Abs: 6.8 10*3/uL (ref 1.7–7.7)
Neutrophils Relative %: 79 %
Platelets: 317 10*3/uL (ref 150–400)
RBC: 4.72 MIL/uL (ref 4.22–5.81)
RDW: 11.9 % (ref 11.5–15.5)
WBC: 8.5 10*3/uL (ref 4.0–10.5)
nRBC: 0 % (ref 0.0–0.2)

## 2020-12-03 LAB — COMPREHENSIVE METABOLIC PANEL
ALT: 25 U/L (ref 0–44)
AST: 21 U/L (ref 15–41)
Albumin: 4.6 g/dL (ref 3.5–5.0)
Alkaline Phosphatase: 82 U/L (ref 38–126)
Anion gap: 10 (ref 5–15)
BUN: 14 mg/dL (ref 6–20)
CO2: 26 mmol/L (ref 22–32)
Calcium: 9.5 mg/dL (ref 8.9–10.3)
Chloride: 102 mmol/L (ref 98–111)
Creatinine, Ser: 0.82 mg/dL (ref 0.61–1.24)
GFR, Estimated: >60 mL/min (ref >60)
Glucose, Bld: 106 mg/dL — ABNORMAL HIGH (ref 70–99)
Potassium: 4 mmol/L (ref 3.5–5.1)
Sodium: 138 mmol/L (ref 135–145)
Total Bilirubin: 0.5 mg/dL (ref 0.3–1.2)
Total Protein: 8 g/dL (ref 6.5–8.1)

## 2020-12-03 LAB — URINALYSIS, COMPLETE (UACMP) WITH MICROSCOPIC
Bilirubin Urine: NEGATIVE
Glucose, UA: NEGATIVE mg/dL
Hgb urine dipstick: NEGATIVE
Ketones, ur: NEGATIVE mg/dL
Leukocytes,Ua: NEGATIVE
Nitrite: NEGATIVE
Protein, ur: NEGATIVE mg/dL
RBC / HPF: NONE SEEN RBC/hpf (ref 0–5)
Specific Gravity, Urine: 1.025 (ref 1.005–1.030)
Squamous Epithelial / HPF: NONE SEEN (ref 0–5)
pH: 5.5 (ref 5.0–8.0)

## 2020-12-03 LAB — RESP PANEL BY RT-PCR (FLU A&B, COVID) ARPGX2
Influenza A by PCR: NEGATIVE
Influenza B by PCR: NEGATIVE
SARS Coronavirus 2 by RT PCR: NEGATIVE

## 2020-12-03 NOTE — Discharge Instructions (Addendum)
Due to abundance of caution I will go ahead and get some labs just to make sure he is not dehydrated.  Also will do a UA.  Lab results in the chart.  No electrolyte abnormalities. We will give him the information for outpatient cardiology.  I suspect he will probably need a Holter monitor.  I went over the fact that if he does lose consciousness he needs to return to the emergency room immediately. I will reach out to his primary care physician just to make sure that she is aware of the current situation.   I will give him a work note just to stay out of work today and tomorrow and to return when he is asymptomatic.   we will discharge from care at this time.

## 2020-12-03 NOTE — Telephone Encounter (Signed)
Dr. Zachery Dauer from [facility] called to let Dr. 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 EKG was clear and pt is supposed to follow-up with Dr. Philemon Kingdom PA.

## 2020-12-03 NOTE — Telephone Encounter (Signed)
Patient called stating that he felt bad last week with muscle aches, headache, sore throat and diarrhea. Patient stated that he was tested for covid at [facility] about 24 hours after symptoms started. Patient stated that the test came back negative. Patient stated that he felt better today so he went back to work. Patient stated that he is now feeling lightheaded, having some muscle aches and some SOB. Patient stated that he may be having some anxiety because of his symptoms.  Advised patient that he may have tested too soon after onset of symptoms last week. Patient was advised that he should go to an UC to be evaluated. Patient was advised not to drive which he stated that he will get his girlfriend to drive him. Patient was given information on the [place] [facility]. Patient stated that he will probably be there within the hour.

## 2020-12-03 NOTE — ED Triage Notes (Signed)
Patient states he was at work today and started feeling light headed. He also states he has been short of breath. He states this has happened before.

## 2020-12-03 NOTE — ED Provider Notes (Signed)
[facility]    CSN: [id number] Arrival date & time: 12/03/20  1254      History   Chief Complaint Chief Complaint  Patient presents with  . Shortness of Breath  . Dizziness    HPI Jay Webb is a 19 y.o. male.   Patient is pleasant 19 year old male who presents for evaluation of lightheadedness without a syncopal episode.  Review of the chart indicates that he has had similar symptoms in the past.  He was evaluated in the emergency room back in 2016 and seen by pediatric cardiology.  Had a full work-up.  He reports some shortness of breath for about a week with a cough.  No syncopal episodes.  He has been drinking plenty of fluids and does not feel as though he is dehydrated.  He has been urinating appropriately.  He has not had any vaccines to Covid or influenza.  He reports he was at work and he was [employer] and felt lightheaded.  He works at the [employer] center for [employer] in [place] [employer].  Review of his EKG in the past show that he has right bundle branch block.  I reviewed cardiology notes in detail and he has had a full work-up.  He was supposed to follow-up with cardiology but he has not made that appointment yet.  Normally sees Dr. Selena Batten with [facility] in [place].  Regarding today's episode he denies any leg swelling.  He does have a history of a murmur when he was a child.  Again he was told that he had an abnormal rhythm on his EKG but had a full pediatric cardiology work-up.  At that time he had an normal echo cardiogram as well.     Past Medical History:  Diagnosis Date  . ADHD 2014    Patient Active Problem List   Diagnosis Date Noted  . Pilonidal cyst 07/30/2020  . Viral URI with cough 09/07/2019  . ADHD (attention deficit hyperactivity disorder), inattentive type 06/07/2019  . Heart murmur 06/07/2019  . Atypical chest pain 06/07/2019    Past Surgical History:  Procedure Laterality Date  . VARICOCELE EXCISION         Home  Medications    Prior to Admission medications   Medication Sig Start Date End Date Taking? Authorizing Provider  amoxicillin-clavulanate (AUGMENTIN) 875-125 MG tablet Take 1 tablet by mouth 2 (two) times daily. 07/29/20   Joaquim Nam, MD  loratadine (CLARITIN) 10 MG tablet Take 10 mg by mouth daily.    [provider]    Family History Family History  Problem Relation Age of Onset  . Arthritis Mother   . Hypertension Mother   . Thyroid disease Mother   . Stroke Maternal Grandfather 79    Social History Social History   Tobacco Use  . Smoking status: Never Smoker  . Smokeless tobacco: Never Used  Vaping Use  . Vaping Use: Never used  Substance Use Topics  . Alcohol use: Not Currently  . Drug use: Not Currently     Allergies   Other   Review of Systems Review of Systems  Constitutional: Negative for activity change, appetite change, chills, diaphoresis, fatigue and fever.  HENT: Negative for congestion, ear pain, rhinorrhea, sinus pressure, sinus pain, sneezing and sore throat.   Eyes: Negative for pain.  Respiratory: Positive for cough and shortness of breath.   Cardiovascular: Negative for chest pain.  Gastrointestinal: Negative for abdominal pain.  Genitourinary: Negative for dysuria.  Neurological: Positive  for dizziness and light-headedness. Negative for tremors, seizures, syncope, facial asymmetry, speech difficulty, weakness, numbness and headaches.     Physical Exam Triage Vital Signs ED Triage Vitals  Enc Vitals Group     BP 12/03/20 1336 (!) 146/110     Pulse Rate 12/03/20 1336 87     Resp 12/03/20 1336 18     Temp 12/03/20 1336 98.9 F (37.2 C)     Temp Source 12/03/20 1336 Oral     SpO2 12/03/20 1336 99 %     Weight 12/03/20 1337 185 lb (83.9 kg)     Height 12/03/20 1337 5\' 11"  (1.803 m)     Head Circumference --      Peak Flow --      Pain Score 12/03/20 1336 0     Pain Loc --      Pain Edu? --      Excl. in GC? --    No data  found.  Updated Vital Signs BP (!) 146/110 (BP Location: Right Arm)   Pulse 87   Temp 98.9 F (37.2 C) (Oral)   Resp 18   Ht 5\' 11"  (1.803 m)   Wt 83.9 kg   SpO2 99%   BMI 25.80 kg/m   Visual Acuity Right Eye Distance:   Left Eye Distance:   Bilateral Distance:    Right Eye Near:   Left Eye Near:    Bilateral Near:     Physical Exam Vitals and nursing note reviewed.  Constitutional:      General: He is not in acute distress.    Appearance: He is well-developed. He is not ill-appearing, toxic-appearing or diaphoretic.  HENT:     Head: Normocephalic and atraumatic.     Mouth/Throat:     Mouth: Mucous membranes are moist.     Pharynx: Oropharynx is clear.  Eyes:     Extraocular Movements: Extraocular movements intact.     Pupils: Pupils are equal, round, and reactive to light.  Neck:     Vascular: No JVD.  Cardiovascular:     Rate and Rhythm: Normal rate and regular rhythm.  No extrasystoles are present.    Pulses: Normal pulses. No decreased pulses.     Heart sounds: Normal heart sounds. No murmur heard. No friction rub. No gallop.   Pulmonary:     Effort: Pulmonary effort is normal.     Breath sounds: No decreased breath sounds, wheezing, rhonchi or rales.  Musculoskeletal:     Cervical back: Normal range of motion and neck supple.  Skin:    General: Skin is warm and dry.     Capillary Refill: Capillary refill takes less than 2 seconds.  Neurological:     General: No focal deficit present.     Mental Status: He is alert.      UC Treatments / Results  Labs (all labs ordered are listed, but only abnormal results are displayed) Labs Reviewed  COMPREHENSIVE METABOLIC PANEL - Abnormal; Notable for the following components:      Result Value   Glucose, Bld 106 (*)    All other components within normal limits  URINALYSIS, COMPLETE (UACMP) WITH MICROSCOPIC - Abnormal; Notable for the following components:   APPearance HAZY (*)    Bacteria, UA RARE (*)     All other components within normal limits  RESP PANEL BY RT-PCR (FLU A&B, COVID) ARPGX2  CBC WITH DIFFERENTIAL/PLATELET    EKG  EKG done in the office today and repeated showed a  ventricular rate between 57 and 62 bpm with a normal PR interval, QRS duration and a normal QT interval.  There is associated right bundle branch block.  There is no significant change from prior EKGs.  There is inverted P wave suggestive of AV nodal trigger.   Radiology No results found.  Procedures Procedures (including critical care time)  Medications Ordered in UC Medications - No data to display  Initial Impression / Assessment and Plan / UC Course  I have reviewed the triage vital signs and the nursing notes.  Pertinent labs & imaging results that were available during my care of the patient were reviewed by me and considered in my medical decision making (see chart for details).     Clinical impression: 19 year old male presents with lightheadedness and dizziness after exertion.  This is an acute flare of a chronic condition.  He has been fully worked up by cardiology in the past.  He was also seen in the emergency room back in April 2021 and was supposed to follow-up with Dr. Juliann Pares but has not done so.  Patient is clinically stable on exam and EKG does not show any changes from prior EKGs.  Treatment plan: 1.  The findings and treatment plan were discussed in detail with the patient.  Patient was in agreement. 2.  Due to abundance of caution I will go ahead and get some labs just to make sure he is not dehydrated.  Also will do a UA.  Lab results in the chart.  No electrolyte abnormalities. 3.  We will give him the information for outpatient cardiology.  I suspect he will probably need a Holter monitor.  I went over the fact that if he does lose consciousness he needs to return to the emergency room immediately. 4.  I will reach out to his primary care physician just to make sure that she is aware  of the current situation.  #5 I will give him a work note just to stay out of work today and tomorrow and to return when he is asymptomatic.   5. we will discharge from care at this time.   Final Clinical Impressions(s) / UC Diagnoses   Final diagnoses:  Dizziness  Shortness of breath  Lightheadedness  Right bundle branch block (RBBB) on electrocardiogram (ECG)     Discharge Instructions     Due to abundance of caution I will go ahead and get some labs just to make sure he is not dehydrated.  Also will do a UA.  Lab results in the chart.  No electrolyte abnormalities. We will give him the information for outpatient cardiology.  I suspect he will probably need a Holter monitor.  I went over the fact that if he does lose consciousness he needs to return to the emergency room immediately. I will reach out to his primary care physician just to make sure that she is aware of the current situation.   I will give him a work note just to stay out of work today and tomorrow and to return when he is asymptomatic.   we will discharge from care at this time.    ED Prescriptions    None     PDMP not reviewed this encounter.   Delton See, MD 12/03/20 1721

## 2020-12-03 NOTE — Telephone Encounter (Signed)
Agree with retesting and evaluation.

## 2020-12-04 NOTE — Telephone Encounter (Signed)
Appreciate the update 

## 2020-12-10 NOTE — Telephone Encounter (Signed)
error 

## 2020-12-22 ENCOUNTER — Other Ambulatory Visit: Payer: Self-pay

## 2020-12-22 ENCOUNTER — Ambulatory Visit (INDEPENDENT_AMBULATORY_CARE_PROVIDER_SITE_OTHER): Payer: Commercial Managed Care - PPO | Admitting: Family Medicine

## 2020-12-22 ENCOUNTER — Encounter: Payer: Self-pay | Admitting: Family Medicine

## 2020-12-22 VITALS — BP 120/78 | HR 55 | Temp 98.5°F | Ht 71.75 in | Wt 203.2 lb

## 2020-12-22 DIAGNOSIS — Z114 Encounter for screening for human immunodeficiency virus [HIV]: Secondary | ICD-10-CM

## 2020-12-22 DIAGNOSIS — Z1159 Encounter for screening for other viral diseases: Secondary | ICD-10-CM

## 2020-12-22 DIAGNOSIS — Z113 Encounter for screening for infections with a predominantly sexual mode of transmission: Secondary | ICD-10-CM

## 2020-12-22 DIAGNOSIS — E663 Overweight: Secondary | ICD-10-CM | POA: Diagnosis not present

## 2020-12-22 DIAGNOSIS — Z0000 Encounter for general adult medical examination without abnormal findings: Secondary | ICD-10-CM | POA: Diagnosis not present

## 2020-12-22 LAB — LIPID PANEL
Cholesterol: 154 mg/dL (ref 0–200)
HDL: 40.5 mg/dL (ref >39.00)
LDL Cholesterol: 99 mg/dL (ref 0–99)
NonHDL: 113.19
Total CHOL/HDL Ratio: 4
Triglycerides: 70 mg/dL (ref 0.0–149.0)
VLDL: 14 mg/dL (ref 0.0–40.0)
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
## 2020-12-22 NOTE — Patient Instructions (Signed)
Tension Headache, Adult A tension headache is a feeling of pain, pressure, or aching in the head. It is often felt over the front and sides of the head. Tension headaches can last from 30 minutes to several days. What are the causes? The cause of this condition is not known. Sometimes, tension headaches are brought on by stress, worry (anxiety), or depression. Other things that may set them off include:  Alcohol.  Too much caffeine or caffeine withdrawal.  Colds, flu, or sinus infections.  Dental problems. This can include clenching your teeth.  Being tired.  Holding your head and neck in the same position for a long time, such as while using a computer.  Smoking.  Arthritis in the neck. What are the signs or symptoms?  Feeling pressure around the head.  A dull ache in the head.  Pain over the front and sides of the head.  Feeling sore or tender in the muscles of the head, neck, and shoulders. How is this treated? This condition may be treated with lifestyle changes and with medicines that help relieve symptoms. Follow these instructions at home: Managing pain  Take over-the-counter and prescription medicines only as told by your doctor.  When you have a headache, lie down in a dark, quiet room.  If told, put ice on your head and neck. To do this: ? Put ice in a plastic bag. ? Place a towel between your skin and the bag. ? Leave the ice on for 20 minutes, 2-3 times a day. ? Take off the ice if your skin turns bright red. This is very important. If you cannot feel pain, heat, or cold, you have a greater risk of damage to the area.  If told, put heat on the back of your neck. Do this as often as told by your doctor. Use the heat source that your doctor recommends, such as a moist heat pack or a heating pad. ? Place a towel between your skin and the heat source. ? Leave the heat on for 20-30 minutes. ? Take off the heat if your skin turns bright red. This is very  important. If you cannot feel pain, heat, or cold, you have a greater risk of getting burned. Eating and drinking  Eat meals on a regular schedule.  If you drink alcohol: ? Limit how much you have to:  0-1 drink a day for women who are not pregnant.  0-2 drinks a day for men. ? Know how much alcohol is in your drink. In the [place] one drink equals one 12 oz bottle of beer (355 mL), one 5 oz glass of wine (148 mL), or one 1 oz glass of hard liquor (44 mL).  Drink enough fluid to keep your pee (urine) pale yellow.  Do not use a lot of caffeine, or stop using caffeine. Lifestyle  Get 7-9 hours of sleep each night. Or get the amount of sleep that your doctor tells you to.  At bedtime, keep computers, phones, and tablets out of your room.  Find ways to lessen your stress. This may include: ? Exercise. ? Deep breathing. ? Yoga. ? Listening to music. ? Thinking positive thoughts.  Sit up straight. Try to relax your muscles.  Do not smoke or use any products that contain nicotine or tobacco. If you need help quitting, ask your doctor. General instructions  Avoid things that can bring on headaches. Keep a headache journal to see what may bring on headaches. For example, write down: ?   What you eat and drink. ? How much sleep you get. ? Any change to your diet or medicines.  Keep all follow-up visits.   Contact a doctor if:  Your headache does not get better.  Your headache comes back.  You have a headache, and sounds, light, or smells bother you.  You feel like you may vomit, or you vomit.  Your stomach hurts. Get help right away if:  You all of a sudden get a very bad headache with any of these things: ? A stiff neck. ? Feeling like you may vomit. ? Vomiting. ? Feeling mixed up (confused). ? Feeling weak in one part or one side of your body. ? Having trouble seeing or speaking, or both. ? Feeling short of breath. ? A rash. ? Feeling very sleepy. ? Pain in your  eye or ear. ? Trouble walking or balancing. ? Feeling like you will faint, or you faint. Summary  A tension headache is pain, pressure, or aching in your head.  Tension headaches can last from 30 minutes to several days.  Lifestyle changes and medicines may help relieve pain. This information is not intended to replace advice given to you by your health care provider. Make sure you discuss any questions you have with your health care provider. Document Revised: 08/28/2020 Document Reviewed: 08/28/2020 Elsevier Patient Education  2021 [employer]  

## 2020-12-23 LAB — HIV ANTIBODY (ROUTINE TESTING W REFLEX): HIV 1&2 Ab, 4th Generation: NONREACTIVE

## 2020-12-23 LAB — HEPATITIS C ANTIBODY
Hepatitis C Ab: NONREACTIVE
SIGNAL TO CUT-OFF: 0.02 (ref <1.00)

## 2020-12-23 LAB — C. TRACHOMATIS/N. GONORRHOEAE RNA
C. trachomatis RNA, TMA: NOT DETECTED
N. gonorrhoeae RNA, TMA: NOT DETECTED

## 2020-12-23 LAB — RPR: RPR Ser Ql: NONREACTIVE

## 2021-04-22 ENCOUNTER — Encounter: Payer: Self-pay | Admitting: Family Medicine

## 2021-04-22 ENCOUNTER — Ambulatory Visit: Payer: Commercial Managed Care - PPO | Admitting: Family Medicine

## 2021-04-22 ENCOUNTER — Other Ambulatory Visit: Payer: Self-pay

## 2021-04-22 VITALS — BP 110/70 | HR 92 | Temp 98.2°F | Ht 72.0 in | Wt 186.2 lb

## 2021-04-22 DIAGNOSIS — R319 Hematuria, unspecified: Secondary | ICD-10-CM

## 2021-04-22 DIAGNOSIS — N3001 Acute cystitis with hematuria: Secondary | ICD-10-CM | POA: Diagnosis not present

## 2021-04-22 LAB — POC URINALSYSI DIPSTICK (AUTOMATED)
Bilirubin, UA: NEGATIVE
Blood, UA: POSITIVE
Glucose, UA: NEGATIVE
Ketones, UA: NEGATIVE
Nitrite, UA: NEGATIVE
Protein, UA: POSITIVE — AB
Spec Grav, UA: 1.02 (ref 1.010–1.025)
Urobilinogen, UA: 0.2 E.U./dL
pH, UA: 6.5 (ref 5.0–8.0)

## 2021-04-22 MED ORDER — SULFAMETHOXAZOLE-TRIMETHOPRIM 800-160 MG PO TABS: 1.0000 | ORAL_TABLET | Freq: Two times a day (BID) | ORAL | 0 refills | Status: AC

## 2021-04-22 NOTE — Assessment & Plan Note (Signed)
Likely 2/2 to UTI but will repeat urine in 2 months to check for persistence/clearance.

## 2021-04-22 NOTE — Progress Notes (Signed)
Subjective:     Jay Webb is a 20 y.o. male presenting for Hematuria (X 1 day ), Urinary Frequency (X week ), and Dysuria     Hematuria The current episode started yesterday. He describes the hematuria as gross hematuria. He reports no clotting in his urine stream. He describes his urine color as dark red. Irritative symptoms include frequency and urgency. Obstructive symptoms do not include a slower stream. Associated symptoms include dysuria and hesitancy. Pertinent negatives include no chills, flank pain, genital pain, inability to urinate, nausea or vomiting. He is not sexually active. There is no history of kidney stones.  Urinary Frequency  Associated symptoms include frequency, hematuria, hesitancy and urgency. Pertinent negatives include no chills, flank pain, nausea or vomiting. There is no history of catheterization, kidney stones or recurrent UTIs.  Dysuria  This is a new problem. The current episode started 1 to 4 weeks ago. The problem occurs intermittently. The pain is mild. There has been no fever. Associated symptoms include frequency, hematuria, hesitancy and urgency. Pertinent negatives include no chills, flank pain, nausea or vomiting. He has tried increased fluids for the symptoms. The treatment provided moderate relief. There is no history of catheterization, kidney stones or recurrent UTIs.        Review of Systems  Constitutional: Negative for chills.  Gastrointestinal: Negative for nausea and vomiting.  Genitourinary: Positive for dysuria, frequency, hematuria, hesitancy and urgency. Negative for flank pain.     Social History   Tobacco Use  Smoking Status Never Smoker  Smokeless Tobacco Never Used        Objective:    BP Readings from Last 3 Encounters:  04/22/21 110/70  12/22/20 120/78  12/03/20 (!) 146/110   Wt Readings from Last 3 Encounters:  04/22/21 186 lb 4 oz (84.5 kg)  12/22/20 203 lb 4 oz (92.2 kg)  12/03/20 185 lb (83.9 kg)     BP 110/70   Pulse 92   Temp 98.2 F (36.8 C) (Temporal)   Ht 6' (1.829 m)   Wt 186 lb 4 oz (84.5 kg)   SpO2 98%   BMI 25.26 kg/m    Physical Exam Constitutional:      Appearance: Normal appearance. He is not ill-appearing or diaphoretic.  HENT:     Right Ear: External ear normal.     Left Ear: External ear normal.  Eyes:     General: No scleral icterus.    Extraocular Movements: Extraocular movements intact.     Conjunctiva/sclera: Conjunctivae normal.  Cardiovascular:     Rate and Rhythm: Normal rate and regular rhythm.     Heart sounds: No murmur heard.   Pulmonary:     Effort: Pulmonary effort is normal. No respiratory distress.     Breath sounds: Normal breath sounds. No wheezing.  Abdominal:     General: Abdomen is flat. Bowel sounds are normal. There is no distension.     Palpations: Abdomen is soft.     Tenderness: There is abdominal tenderness in the suprapubic area. There is no right CVA tenderness, left CVA tenderness, guarding or rebound.  Musculoskeletal:     Cervical back: Neck supple.  Skin:    General: Skin is warm and dry.  Neurological:     Mental Status: He is alert. Mental status is at baseline.  Psychiatric:        Mood and Affect: Mood normal.      UA: positive blood, LE, neg nitrites, +protein     Assessment &  Plan:   Problem List Items Addressed This Visit      Other   Hematuria    Likely 2/2 to UTI but will repeat urine in 2 months to check for persistence/clearance.       Relevant Orders   POCT Urinalysis Dipstick (Automated) (Completed)   POCT urinalysis dipstick    Other Visit Diagnoses    Acute cystitis with hematuria    -  Primary   Relevant Medications   sulfamethoxazole-trimethoprim (BACTRIM DS) 800-160 MG tablet     UA and symptoms consistent with acute cystitis. Since male will treat for 7 days. No signs of prostatitis or severe infection to warrant prostate exam today but discussed if not improving this may be  a concern.   No recent sexual intercourse or discharge to suggest STI.   Return if symptoms worsen or fail to improve.  Lynnda Child, MD  This visit occurred during the SARS-CoV-2 public health emergency.  Safety protocols were in place, including screening questions prior to the visit, additional usage of staff PPE, and extensive cleaning of exam room while observing appropriate contact time as indicated for disinfecting solutions.

## 2021-04-22 NOTE — Addendum Note (Signed)
Addended by: Erby Pian on: 04/22/2021 12:20 PM   Modules accepted: Orders

## 2021-04-22 NOTE — Patient Instructions (Signed)
Urine infection Start antibiotics  If not improving or worsening call   Make a lab appointment in 2 months to repeat your urine to make sure the blood is gone -- come to the office if you continue to have blood in the urine.

## 2021-04-23 LAB — URINE CULTURE
MICRO NUMBER:: 11877257
Result:: NO GROWTH
SPECIMEN QUALITY:: ADEQUATE

## 2021-04-23 NOTE — Addendum Note (Signed)
Addended by: Lynnda Child on: 04/23/2021 05:15 PM   Modules accepted: Level of Service

## 2022-07-05 ENCOUNTER — Ambulatory Visit (INDEPENDENT_AMBULATORY_CARE_PROVIDER_SITE_OTHER): Payer: Commercial Managed Care - PPO | Admitting: Family Medicine

## 2022-07-05 ENCOUNTER — Encounter: Payer: Self-pay | Admitting: Family Medicine

## 2022-07-05 VITALS — BP 122/74 | HR 67 | Temp 97.3°F | Ht 71.0 in | Wt 179.0 lb

## 2022-07-05 DIAGNOSIS — Z23 Encounter for immunization: Secondary | ICD-10-CM

## 2022-07-05 DIAGNOSIS — R361 Hematospermia: Secondary | ICD-10-CM | POA: Diagnosis not present

## 2022-07-05 DIAGNOSIS — Z113 Encounter for screening for infections with a predominantly sexual mode of transmission: Secondary | ICD-10-CM

## 2022-07-05 DIAGNOSIS — Z0000 Encounter for general adult medical examination without abnormal findings: Secondary | ICD-10-CM

## 2022-07-05 LAB — POC URINALSYSI DIPSTICK (AUTOMATED)
Bilirubin, UA: NEGATIVE
Blood, UA: NEGATIVE
Glucose, UA: NEGATIVE
Ketones, UA: NEGATIVE
Leukocytes, UA: NEGATIVE
Nitrite, UA: NEGATIVE
Protein, UA: NEGATIVE
Spec Grav, UA: 1.025 (ref 1.010–1.025)
Urobilinogen, UA: 0.2 E.U./dL
pH, UA: 6 (ref 5.0–8.0)

## 2022-07-05 NOTE — Progress Notes (Signed)
Annual Exam   Chief Complaint:  Chief Complaint  Patient presents with   Annual Exam   hematospermia    On and off over last 3 weeks after unprotected sex with same partner.     History of Present Illness:  Jay Webb is a 21 y.o. presents today for annual examination.    #Hematospermia - 4 weeks of symptoms - not frequent - 1-2 times per week - does not some groin trauma via soccer in the last few months  Only 1 partner in the last year   Nutrition/Lifestyle Diet: more protein, good overall Exercise: working out regularly He is single partner, contraception - none currently.  Any issues getting or maintaining an erection? no  Social History   Tobacco Use  Smoking Status Never  Smokeless Tobacco Never   Social History   Substance and Sexual Activity  Alcohol Use Yes   Comment: less than monthly   Social History   Substance and Sexual Activity  Drug Use Not Currently     Safety The patient wears seatbelts: yes.     The patient feels safe at home and in their relationships: yes.  General Health Dentist in the last year: No Eye doctor: not applicable  Weight Wt Readings from Last 3 Encounters:  07/05/22 179 lb (81.2 kg)  04/22/21 186 lb 4 oz (84.5 kg)  12/22/20 203 lb 4 oz (92.2 kg)   Patient has normal BMI  BMI Readings from Last 1 Encounters:  07/05/22 24.97 kg/m     Chronic disease screening Blood pressure monitoring:  BP Readings from Last 3 Encounters:  07/05/22 122/74  04/22/21 110/70  12/22/20 120/78    Lipid Monitoring: Indication for screening: age >35, obesity, diabetes, family hx, CV risk factors.  Lipid screening: Yes  Lab Results  Component Value Date   CHOL 154 12/22/2020   HDL 40.50 12/22/2020   LDLCALC 99 12/22/2020   TRIG 70.0 12/22/2020   CHOLHDL 4 12/22/2020     Diabetes Screening: age >58, overweight, family hx, PCOS, hx of gestational diabetes, at risk ethnicity, elevated blood pressure >135/80.   Diabetes Screening screening: Not Indicated  No results found for: "HGBA1C"   Immunization History  Administered Date(s) Administered   DTaP 01/09/2001, 03/29/2001, 08/30/2001, 02/14/2002, 02/24/2006   Hepatitis A 01/02/2009, 11/24/2010   Hepatitis B 11/17/2000, 12/22/2000, 08/29/2001   HiB (PRP-OMP) 01/09/2001, 03/29/2001, 08/30/2001, 02/14/2002   Hpv-Unspecified 12/30/2017, 03/01/2018   IPV 01/09/2001, 03/29/2001, 02/14/2002, 02/24/2006   MMR 11/06/2001, 02/24/2006   Meningococcal B Recombinant 12/30/2017, 02/01/2018   Meningococcal Conjugate 02/15/2014   Meningococcal Mcv4o 11/16/2016   Tdap 11/29/2011   Varicella 11/06/2001, 09/03/2015    Past Medical History:  Diagnosis Date   ADHD 2014    Past Surgical History:  Procedure Laterality Date   VARICOCELE EXCISION      Prior to Admission medications   Medication Sig Start Date End Date Taking? Authorizing Provider  loratadine (CLARITIN) 10 MG tablet Take 10 mg by mouth daily as needed. Patient not taking: Reported on 04/22/2021    [provider]    Allergies  Allergen Reactions   Other Itching    Seasonal, cats     Social History   Socioeconomic History   Marital status: Single    Spouse name: Not on file   Number of children: Not on file   Years of education: High school   Highest education level: Not on file  Occupational History   Not on file  Tobacco Use  Smoking status: Never   Smokeless tobacco: Never  Vaping Use   Vaping Use: Never used  Substance and Sexual Activity   Alcohol use: Yes    Comment: less than monthly   Drug use: Not Currently   Sexual activity: Yes    Birth control/protection: None  Other Topics Concern   Not on file  Social History Narrative   06/07/19   From: [place], moved her when he was 21 years old   Living: with Dad   Work: works in [employer]      Family: close with family, sister lives with mom      Enjoys: any sport - soccer and basketball,  spending time with friends, working      Exercise: running and working out at home   Diet: fast food breakfast, eats fruits/veggies, regular dinner      Safety   Seat belts: Yes    Guns: No   Safe in relationships: Yes    Social Determinants of Radio broadcast assistant Strain: Low Risk  (06/07/2019)   Overall Financial Resource Strain (CARDIA)    Difficulty of Paying Living Expenses: Not hard at all  Food Insecurity: Not on file  Transportation Needs: Not on file  Physical Activity: Not on file  Stress: Not on file  Social Connections: Not on file  Intimate Partner Violence: Not on file    Family History  Problem Relation Age of Onset   Arthritis Mother    Hypertension Mother    Thyroid disease Mother    Stroke Maternal Grandfather 68    Review of Systems  Constitutional:  Negative for chills and fever.  HENT:  Negative for congestion and sore throat.   Eyes:  Negative for blurred vision and double vision.  Respiratory:  Negative for shortness of breath.   Cardiovascular:  Negative for chest pain.  Gastrointestinal:  Negative for heartburn, nausea and vomiting.  Genitourinary: Negative.   Musculoskeletal: Negative.  Negative for myalgias.  Skin:  Negative for rash.  Neurological:  Negative for dizziness and headaches.  Endo/Heme/Allergies:  Does not bruise/bleed easily.  Psychiatric/Behavioral:  Negative for depression. The patient is not nervous/anxious.      Physical Exam BP 122/74   Pulse 67   Temp (!) 97.3 F (36.3 C) (Temporal)   Ht _0  (1.803 m)   Wt 179 lb (81.2 kg)   SpO2 98%   BMI 24.97 kg/m    BP Readings from Last 3 Encounters:  07/05/22 122/74  04/22/21 110/70  12/22/20 120/78      Physical Exam Constitutional:      General: He is not in acute distress.    Appearance: He is well-developed. He is not diaphoretic.  HENT:     Head: Normocephalic and atraumatic.     Right Ear: Tympanic membrane and ear canal normal.     Left Ear:  Tympanic membrane and ear canal normal.     Nose: Nose normal.     Mouth/Throat:     Pharynx: Uvula midline.  Eyes:     General: No scleral icterus.    Conjunctiva/sclera: Conjunctivae normal.     Pupils: Pupils are equal, round, and reactive to light.  Cardiovascular:     Rate and Rhythm: Normal rate and regular rhythm.     Heart sounds: Normal heart sounds. No murmur heard. Pulmonary:     Effort: Pulmonary effort is normal. No respiratory distress.     Breath sounds: Normal breath sounds. No wheezing.  Abdominal:     General: Bowel sounds are normal. There is no distension.     Palpations: Abdomen is soft. There is no mass.     Tenderness: There is no abdominal tenderness. There is no guarding.  Musculoskeletal:        General: Normal range of motion.     Cervical back: Normal range of motion and neck supple.  Lymphadenopathy:     Cervical: No cervical adenopathy.  Skin:    General: Skin is warm and dry.     Capillary Refill: Capillary refill takes less than 2 seconds.  Neurological:     Mental Status: He is alert and oriented to person, place, and time.        Results:  PHQ-9:  [place] [facility] Visit from 12/22/2020 in [place] at [place]  PHQ-9 Total Score 3         Assessment: 21 y.o. here for routine annual physical examination.  Plan: Problem List Items Addressed This Visit       Other   Hemospermia    UA w/o signs of infection. Will get GC/C. Advised f/u if not improved in 1 month update and will plan urology referral.       Relevant Orders   C. trachomatis/N. gonorrhoeae RNA   POCT Urinalysis Dipstick (Automated) (Completed)   Other Visit Diagnoses     Annual physical exam    -  Primary   Routine screening for STI (sexually transmitted infection)       Relevant Orders   C. trachomatis/N. gonorrhoeae RNA   HIV Antibody (routine testing w rflx)   RPR   Hepatitis C antibody   Need for HPV vaccination       Relevant Orders    HPV 9-valent vaccine,Recombinat   Need for Tdap vaccination       Relevant Orders   Tdap vaccine greater than or equal to 7yo IM       Screening: -- Blood pressure screen normal -- cholesterol screening: not due for screening -- Weight screening: normal -- Diabetes Screening: not due for screening -- Nutrition: Encouraged healthy diet and exercise  The ASCVD Risk score (Arnett DK, et al., 2019) failed to calculate for the following reasons:   The 2019 ASCVD risk score is only valid for ages 32 to 89  -- Statin therapy for Age 29-75 with CVD risk >7.5%  Psych -- Depression screening (PHQ-9):  [place] Visit from 12/22/2020 in [place] at [facility]  PHQ-9 Total Score 3        Safety -- tobacco screening: not using -- alcohol screening:  low-risk usage. -- no evidence of domestic violence or intimate partner violence.   Cancer Screening -- No age related cancer screening due  Immunizations Immunization History  Administered Date(s) Administered   DTaP 01/09/2001, 03/29/2001, 08/30/2001, 02/14/2002, 02/24/2006   Hepatitis A 01/02/2009, 11/24/2010   Hepatitis B 11/17/2000, 12/22/2000, 08/29/2001   HiB (PRP-OMP) 01/09/2001, 03/29/2001, 08/30/2001, 02/14/2002   Hpv-Unspecified 12/30/2017, 03/01/2018   IPV 01/09/2001, 03/29/2001, 02/14/2002, 02/24/2006   MMR 11/06/2001, 02/24/2006   Meningococcal B Recombinant 12/30/2017, 02/01/2018   Meningococcal Conjugate 02/15/2014   Meningococcal Mcv4o 11/16/2016   Tdap 11/29/2011   Varicella 11/06/2001, 09/03/2015    -- flu vaccine not in season -- TDAP q10 years up to date -- Covid-19 Vaccine up to date   Encouraged regular vision and dental screening. Encouraged healthy exercise and diet.   Lesleigh Noe

## 2022-07-05 NOTE — Patient Instructions (Signed)
Make a dentist appointment   Blood in Sperm - if still happening in 1 month call or send mychart message

## 2022-07-05 NOTE — Assessment & Plan Note (Signed)
UA w/o signs of infection. Will get GC/C. Advised f/u if not improved in 1 month update and will plan urology referral.

## 2022-07-06 LAB — HEPATITIS C ANTIBODY: Hepatitis C Ab: NONREACTIVE

## 2022-07-06 LAB — HIV ANTIBODY (ROUTINE TESTING W REFLEX): HIV 1&2 Ab, 4th Generation: NONREACTIVE

## 2022-07-06 LAB — C. TRACHOMATIS/N. GONORRHOEAE RNA
C. trachomatis RNA, TMA: DETECTED — AB
N. gonorrhoeae RNA, TMA: NOT DETECTED

## 2022-07-06 LAB — RPR: RPR Ser Ql: NONREACTIVE

## 2022-07-07 ENCOUNTER — Telehealth: Payer: Self-pay | Admitting: Family Medicine

## 2022-07-07 DIAGNOSIS — A749 Chlamydial infection, unspecified: Secondary | ICD-10-CM

## 2022-07-07 MED ORDER — AZITHROMYCIN 500 MG PO TABS: 1000.0000 mg | ORAL_TABLET | Freq: Once | ORAL | 0 refills | Status: AC

## 2022-07-07 NOTE — Telephone Encounter (Signed)
Problem List Items Addressed This Visit   None Visit Diagnoses     Chlamydia infection    -  Primary   Relevant Medications   azithromycin (ZITHROMAX) 500 MG tablet       Pt instructed no intercourse for 7 days  Partner will see her PCP for testing/treatment

## 2022-07-07 NOTE — Telephone Encounter (Signed)
Patient called in and stated that he cant take pill formed medication. He needs liquid form.

## 2022-07-08 NOTE — Telephone Encounter (Signed)
Patient also wanted to know if its possible to do a retest as well for what its for. Please advise

## 2022-07-09 ENCOUNTER — Other Ambulatory Visit: Payer: Self-pay | Admitting: Family Medicine

## 2022-07-09 DIAGNOSIS — A749 Chlamydial infection, unspecified: Secondary | ICD-10-CM

## 2022-07-09 MED ORDER — AZITHROMYCIN 200 MG/5ML PO SUSR: 1000.0000 mg | Freq: Every day | ORAL | 0 refills | Status: DC

## 2022-07-09 NOTE — Telephone Encounter (Signed)
Updated prescription to liquid.   Dose is the same and only 1 time.   Repeat testing to see that he cleared the infection is not recommended.   He can return to the office if his symptoms do not resolve and we can repeat.

## 2022-07-09 NOTE — Telephone Encounter (Signed)
Notified pt of new Rx and dr. Elmyra Ricks message. Pt stated understanding.

## 2022-07-09 NOTE — Addendum Note (Signed)
Addended by: Lynnda Child on: 07/09/2022 08:07 AM   Modules accepted: Orders

## 2022-08-04 ENCOUNTER — Ambulatory Visit: Payer: Commercial Managed Care - PPO | Admitting: Family Medicine

## 2022-08-04 ENCOUNTER — Encounter: Payer: Self-pay | Admitting: Family Medicine

## 2022-08-04 VITALS — BP 110/62 | HR 68 | Temp 97.7°F | Wt 181.5 lb

## 2022-08-04 DIAGNOSIS — R361 Hematospermia: Secondary | ICD-10-CM

## 2022-08-04 DIAGNOSIS — Z118 Encounter for screening for other infectious and parasitic diseases: Secondary | ICD-10-CM

## 2022-08-04 NOTE — Assessment & Plan Note (Signed)
Persisting. Unlikely to be related go chlamydia. Referral to urology. Offered [place] he will wait to see the specialist

## 2022-08-04 NOTE — Progress Notes (Signed)
   Subjective:     Jay Webb is a 20 y.o. male presenting for Advice Only (Questions about his chlamydia dx) and Referral (Urology )     HPI  #chlamydia positive - finished abx - same partner x 2.5 years - hx of annual negative screening - denies other partners - current partner saw her provider and tested negative - wondering about false positive   #hematospermia - persisting - no improvement - hx of vericocel procedure - would like to see urology   Review of Systems   Social History   Tobacco Use  Smoking Status Never  Smokeless Tobacco Never        Objective:    BP Readings from Last 3 Encounters:  08/04/22 110/62  07/05/22 122/74  04/22/21 110/70   Wt Readings from Last 3 Encounters:  08/04/22 181 lb 8 oz (82.3 kg)  07/05/22 179 lb (81.2 kg)  04/22/21 186 lb 4 oz (84.5 kg)    BP 110/62   Pulse 68   Temp 97.7 F (36.5 C) (Temporal)   Wt 181 lb 8 oz (82.3 kg)   SpO2 98%   BMI 25.31 kg/m    Physical Exam Constitutional:      Appearance: Normal appearance. He is not ill-appearing or diaphoretic.  HENT:     Right Ear: External ear normal.     Left Ear: External ear normal.     Nose: Nose normal.  Eyes:     General: No scleral icterus.    Extraocular Movements: Extraocular movements intact.     Conjunctiva/sclera: Conjunctivae normal.  Cardiovascular:     Rate and Rhythm: Normal rate.  Pulmonary:     Effort: Pulmonary effort is normal.  Musculoskeletal:     Cervical back: Neck supple.  Skin:    General: Skin is warm and dry.  Neurological:     Mental Status: He is alert. Mental status is at baseline.  Psychiatric:        Mood and Affect: Mood normal.           Assessment & Plan:   Problem List Items Addressed This Visit       Other   Hemospermia - Primary    Persisting. Unlikely to be related go chlamydia. Referral to urology. Offered [place] he will wait to see the specialist      Relevant Orders   Ambulatory  referral to Urology   Other Visit Diagnoses     Encounter for screening examination for chlamydial infection       Relevant Orders   C. trachomatis/N. gonorrhoeae RNA      Discussed with patient his recent diagnosis for chlamydia.  Completed therapy, but his partner tested negative.  He reports no other partners, wondering about chance of false positive.  Discussed this rate is approximately 2%, he does have negative testing prior to this report over the last 2 years with his current partner. Discussed retesting, even though not necessary to retest after antibiotics.   I spent 20 minutes with pt , obtaining history, examining, reviewing chart, documenting encounter and discussing the above plan of care.   Return if symptoms worsen or fail to improve.  Lynnda Child, MD

## 2022-08-04 NOTE — Patient Instructions (Signed)
#  Referral I have placed a referral to a specialist for you. You should receive a phone call from the specialty office. Make sure your voicemail is not full and that if you are able to answer your phone to unknown or new numbers.   It may take up to 2 weeks to hear about the referral. If you do not hear anything in 2 weeks, please call our office and ask to speak with the referral coordinator.  - Urology

## 2022-08-05 LAB — C. TRACHOMATIS/N. GONORRHOEAE RNA
C. trachomatis RNA, TMA: NOT DETECTED
N. gonorrhoeae RNA, TMA: NOT DETECTED

## 2022-08-11 ENCOUNTER — Ambulatory Visit: Payer: Commercial Managed Care - PPO | Admitting: Urology

## 2022-08-11 ENCOUNTER — Encounter: Payer: Self-pay | Admitting: Urology

## 2022-08-11 VITALS — BP 147/84 | HR 87 | Ht 71.0 in | Wt 181.0 lb

## 2022-08-11 DIAGNOSIS — R361 Hematospermia: Secondary | ICD-10-CM | POA: Diagnosis not present

## 2022-08-11 DIAGNOSIS — R319 Hematuria, unspecified: Secondary | ICD-10-CM | POA: Diagnosis not present

## 2022-08-11 LAB — URINALYSIS, COMPLETE
Bilirubin, UA: NEGATIVE
Glucose, UA: NEGATIVE
Ketones, UA: NEGATIVE
Leukocytes,UA: NEGATIVE
Nitrite, UA: NEGATIVE
Protein,UA: NEGATIVE
RBC, UA: NEGATIVE
Specific Gravity, UA: 1.02 (ref 1.005–1.030)
Urobilinogen, Ur: 0.2 mg/dL (ref 0.2–1.0)
pH, UA: 7.5 (ref 5.0–7.5)

## 2022-08-11 LAB — MICROSCOPIC EXAMINATION: Bacteria, UA: NONE SEEN

## 2022-08-11 NOTE — Progress Notes (Signed)
08/11/2022 10:08 AM   Jay Webb January 19, 2001 [id number]  Referring provider: Lynnda Child, MD [address] [place],  [place] [address]  Chief Complaint  Patient presents with   blood in semen    HPI: 21 year old male who presents today for further evaluation of hematospermia.  He noticed blood in his semen on isolated occasions starting about 2 months ago.  It is not every time.  He denies any pain with intercourse.  He denies any urinary symptoms including hematuria.  Notably, he tested positive for chlamydia last month as well as trichomonas.  He was treated for both.  He requested this testing as part of his annual screening, not because he was having any symptoms.  He questions whether it is a real true positive as his partner tested negative presumably.    PMH: Past Medical History:  Diagnosis Date   ADHD 2014    Surgical History: Past Surgical History:  Procedure Laterality Date   VARICOCELE EXCISION      Home Medications:  Allergies as of 08/11/2022       Reactions   Other Itching   Seasonal, cats        Medication List    as of August 11, 2022 10:08 AM   You have not been prescribed any medications.     Allergies:  Allergies  Allergen Reactions   Other Itching    Seasonal, cats    Family History: Family History  Problem Relation Age of Onset   Arthritis Mother    Hypertension Mother    Thyroid disease Mother    Stroke Maternal Grandfather 74    Social History:  reports that he has never smoked. He has never used smokeless tobacco. He reports current alcohol use. He reports that he does not currently use drugs.   Physical Exam: BP (!) 147/84   Pulse 87   Ht 5\' 11"  (1.803 m)   Wt 181 lb (82.1 kg)   BMI 25.24 kg/m   Constitutional:  Alert and oriented, No acute distress. HEENT: [place] AT, moist mucus membranes.  Trachea midline, no masses. Cardiovascular: No clubbing, cyanosis, or edema. Respiratory: Normal respiratory  effort, no increased work of breathing. Skin: No rashes, bruises or suspicious lesions. Neurologic: Grossly intact, no focal deficits, moving all 4 extremities. Psychiatric: Normal mood and affect.  Laboratory Data: Lab Results  Component Value Date   WBC 8.5 12/03/2020   HGB 14.8 12/03/2020   HCT 42.0 12/03/2020   MCV 89.0 12/03/2020   PLT 317 12/03/2020    Lab Results  Component Value Date   CREATININE 0.82 12/03/2020    Urinalysis UA today is negative, no microscopic blood   Assessment & Plan:    1. Blood in semen  I explained to the patient some of the conditions that may cause hematospermia, such as: disorders of the prostate gland, seminal vesicles, spermatic cord, and ejaculatory duct system; urogenital infections including sexually transmitted infections (eg, chlamydia, herpes simplex virus, gonorrhea, trichomonas); metastatic cancers; vascular malformations; congenital and drug-induced bleeding disorders; and even frequent daily ejaculation over a period of several weeks.  We may never discover a reason for his hematospermia and it is most likely a benign symptom.     Urinalysis today was reassuring  He is too young for concern for prostate cancer thus deferred screening  Suspect this may be related to his recent STI, now resolved - Urinalysis, Complete    12/05/2020, MD  [facility] [address]  [address], [place], [place] [address] [phone]

## 2022-08-16 ENCOUNTER — Ambulatory Visit
Admission: EM | Admit: 2022-08-16 | Discharge: 2022-08-16 | Disposition: A | Discharge status: Home / Self Care | Payer: Commercial Managed Care - PPO | Attending: Emergency Medicine | Admitting: Emergency Medicine

## 2022-08-16 DIAGNOSIS — L0591 Pilonidal cyst without abscess: Secondary | ICD-10-CM | POA: Diagnosis not present

## 2022-08-16 MED ORDER — SULFAMETHOXAZOLE-TRIMETHOPRIM 200-40 MG/5ML PO SUSP
20.0000 mL | Freq: Two times a day (BID) | ORAL | 0 refills | Status: AC
Start: 2022-08-16 — End: 2022-08-23

## 2022-08-16 NOTE — ED Provider Notes (Signed)
Renaldo Fiddler    CSN: [id number] Arrival date & time: 08/16/22  1729      History   Chief Complaint Chief Complaint  Patient presents with   Abscess    HPI Jay Webb is a 21 y.o. male.  Patient presents with 3-day history of painful abscess in his gluteal cleft.  He states he had a pilonidal cyst in this area 2 years ago.  He denies fever, chills, drainage, numbness, weakness, or other symptoms.  No treatments at home.  The history is provided by the patient and medical records.    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 Authorizing Provider  sulfamethoxazole-trimethoprim (BACTRIM) 200-40 MG/5ML suspension Take 20 mLs by mouth 2 (two) times daily for 7 days. 08/16/22 08/23/22 Yes Mickie Bail, NP    Family History Family History  Problem Relation Age of Onset   Arthritis Mother    Hypertension Mother    Thyroid disease Mother    Stroke Maternal Grandfather 79    Social History Social History   Tobacco Use   Smoking status: Never   Smokeless tobacco: Never  Vaping Use   Vaping Use: Never used  Substance Use Topics   Alcohol use: Yes    Comment: less than monthly   Drug use: Not Currently     Allergies   Other   Review of Systems Review of Systems  Constitutional:  Negative for chills and fever.  Gastrointestinal:  Negative for abdominal pain, constipation, diarrhea and vomiting.  Skin:  Positive for wound. Negative for color change.   Neurological:  Negative for weakness and numbness.  All other systems reviewed and are negative.    Physical Exam Triage Vital Signs ED Triage Vitals  Enc Vitals Group     BP 08/16/22 1750 (!) 142/76     Pulse Rate 08/16/22 1750 66     Resp 08/16/22 1750 18     Temp 08/16/22 1750 98.2 F (36.8 C)     Temp src --      SpO2 08/16/22 1750 97 %     Weight 08/16/22 1803 182 lb (82.6 kg)     Height 08/16/22 1803 5\' 11"  (1.803 m)     Head Circumference --      Peak Flow --      Pain Score 08/16/22 1800 9     Pain Loc --      Pain Edu? --      Excl. in GC? --    No data found.  Updated Vital Signs BP (!) 142/76   Pulse 66   Temp 98.2 F (36.8 C)   Resp 18   Ht 5\' 11"  (1.803 m)   Wt 182 lb (82.6 kg)   SpO2 97%   BMI 25.38 kg/m   Visual Acuity Right Eye Distance:   Left Eye Distance:   Bilateral Distance:    Right Eye Near:   Left Eye Near:  Bilateral Near:     Physical Exam Vitals and nursing note reviewed.  Constitutional:      General: He is not in acute distress.    Appearance: Normal appearance. He is well-developed. He is not ill-appearing.  HENT:     Mouth/Throat:     Mouth: Mucous membranes are moist.  Cardiovascular:     Rate and Rhythm: Normal rate and regular rhythm.  Pulmonary:     Effort: Pulmonary effort is normal. No respiratory distress.  Abdominal:     Palpations: Abdomen is soft.     Tenderness: There is no abdominal tenderness.  Musculoskeletal:     Cervical back: Neck supple.  Skin:    General: Skin is warm and dry.     Capillary Refill: Capillary refill takes less than 2 seconds.     Comments: Approximately 3 cm x 3 cm firm tender abscess at top of gluteal cleft.  Scant purulent drainage from area of previous scar.   Neurological:     General: No focal deficit present.     Mental Status: He is alert and oriented to person, place, and time.     Sensory: No sensory deficit.     Motor: No weakness.     Gait: Gait normal.   Psychiatric:        Mood and Affect: Mood normal.        Behavior: Behavior normal.      UC Treatments / Results  Labs (all labs ordered are listed, but only abnormal results are displayed) Labs Reviewed - No data to display  EKG   Radiology No results found.  Procedures Incision and Drainage  Date/Time: 08/16/2022 6:49 PM  Performed by: Mickie Bail, NP Authorized by: Mickie Bail, NP   Consent:    Consent obtained:  Verbal   Consent given by:  Patient   Risks discussed:  Bleeding, incomplete drainage, infection and pain Universal protocol:    Procedure explained and questions answered to patient or proxy's satisfaction: yes   Location:    Type:  Pilonidal cyst   Location:  Anogenital   Anogenital location:  Gluteal cleft Pre-procedure details:    Skin preparation:  Povidone-iodine Anesthesia:    Anesthesia method:  Local infiltration   Local anesthetic:  Lidocaine 1% WITH epi Procedure type:    Complexity:  Simple Procedure details:    Drainage:  Purulent   Drainage amount:  Scant   Wound treatment:  Wound left open   Packing materials:  None Post-procedure details:    Procedure completion:  Tolerated well, no immediate complications  (including critical care time)  Medications Ordered in UC Medications - No data to display  Initial Impression / Assessment and Plan / UC Course  I have reviewed the triage vital signs and the nursing notes.  Pertinent labs & imaging results that were available during my care of the patient were reviewed by me and considered in my medical decision making (see chart for details).   Pilonidal cyst.  I&D performed with return of scant purulent drainage.  Treating with Bactrim (patient requests liquid as he has trouble swallowing pills).  Wound care instructions discussed.  Instructed him to follow-up with a general surgeon and on-call contact information provided.  Education provided on pilonidal cyst.  Patient agrees to plan  of care.   Final Clinical Impressions(s) / UC Diagnoses   Final diagnoses:  Pilonidal cyst     Discharge Instructions      Keep your wound clean  and dry.  Wash it gently twice a day with soap and water.  Apply an antibiotic cream twice a day.    Take the Bactrim as directed.    Follow up with a general surgeon such as the one listed below.          ED Prescriptions     Medication Sig Dispense Auth. Provider   sulfamethoxazole-trimethoprim (BACTRIM) 200-40 MG/5ML suspension Take 20 mLs by mouth 2 (two) times daily for 7 days. 280 mL Mickie Bail, NP      PDMP not reviewed this encounter.   Mickie Bail, NP 08/16/22 [phone]

## 2022-08-16 NOTE — Discharge Instructions (Addendum)
Keep your wound clean and dry.  Wash it gently twice a day with soap and water.  Apply an antibiotic cream twice a day.    Take the Bactrim as directed.    Follow up with a general surgeon such as the one listed below.

## 2022-08-16 NOTE — ED Triage Notes (Signed)
Patient to Urgent Care with complaints of possible pilonidal cyst, reports hx of the same that required drainage. No drainage currently, reports pain started 3 days ago. Denies any other symptoms.

## 2022-08-25 ENCOUNTER — Telehealth: Payer: Self-pay | Admitting: Surgery

## 2022-08-25 ENCOUNTER — Ambulatory Visit: Payer: Commercial Managed Care - PPO | Admitting: Surgery

## 2022-08-25 ENCOUNTER — Encounter: Payer: Self-pay | Admitting: Surgery

## 2022-08-25 VITALS — BP 123/80 | HR 60 | Temp 97.7°F | Ht 71.0 in | Wt 181.2 lb

## 2022-08-25 DIAGNOSIS — L0591 Pilonidal cyst without abscess: Secondary | ICD-10-CM | POA: Diagnosis not present

## 2022-08-25 MED ORDER — GABAPENTIN 300 MG PO CAPS: 300.0000 mg | ORAL_CAPSULE | ORAL | Status: DC

## 2022-08-25 MED ORDER — CELECOXIB 200 MG PO CAPS: 200.0000 mg | ORAL_CAPSULE | ORAL | Status: DC

## 2022-08-25 MED ORDER — ACETAMINOPHEN 500 MG PO TABS: 1000.0000 mg | ORAL_TABLET | ORAL | Status: DC

## 2022-08-25 MED ORDER — CHLORHEXIDINE GLUCONATE CLOTH 2 % EX PADS: 6.0000 | MEDICATED_PAD | Freq: Once | CUTANEOUS | Status: DC

## 2022-08-25 MED ORDER — SODIUM CHLORIDE 0.9 % IV SOLN
2.0000 g | INTRAVENOUS | Status: AC
  Administered 2022-08-26: 2 g via INTRAVENOUS

## 2022-08-25 NOTE — Progress Notes (Signed)
Patient ID: Jay Webb, male   DOB: 10/22/2001, 21 y.o.   MRN: [id number]  HPI Jay Webb is a 20 y.o. male 21 y.o. male seen in consultation at the request of Ms. Tate nurse practitioner.  He presented to the urgent care 9 days ago complaining of gluteal pain and drainage.  The pain is mild to moderate intensity, sharp, intermittent and worsening when he sits.  He has had prior episodes of pilonidal disease and was Lancet.  Most recently 9 days ago I&D at the urgent care.  He was placed on Bactrim.  He endorses that his symptoms have not really improved he continues to have pain and discharge.  No fevers no chills. Does have significant family members with history of pilonidal disease Is able to perform more than 4 METS of activity without any shortness of breath or chest pain.  Last CBC and CMP was completely normal. He also had at some point time some evaluation by cardiology due to atypical chest pain and underwent stress echo that was completely normal. In the past he Did have a varicocele surgery and did well.    HPI  Past Medical History:  Diagnosis Date   ADHD 2014    Past Surgical History:  Procedure Laterality Date   VARICOCELE EXCISION      Family History  Problem Relation Age of Onset   Arthritis Mother    Hypertension Mother    Thyroid disease Mother    Stroke Maternal Grandfather 68    Social History Social History   Tobacco Use   Smoking status: Never   Smokeless tobacco: Never  Vaping Use   Vaping Use: Never used  Substance Use Topics   Alcohol use: Yes    Comment: less than monthly   Drug use: Not Currently    Allergies  Allergen Reactions   Other Itching    Seasonal, cats    No current outpatient medications on file.   No current facility-administered medications for this visit.     Review of Systems Full ROS  was asked and was negative except for the information on the HPI  Physical Exam Blood pressure 123/80, pulse 60, temperature 97.7 F  (36.5 C), temperature source Oral, height 5' 11" (1.803 m), weight 181 lb 3.2 oz (82.2 kg), SpO2 99 %. CONSTITUTIONAL: NAD. EYES: Pupils are equal, round,  Sclera are non-icteric. EARS, NOSE, MOUTH AND THROAT: The oropharynx is clear. The oral mucosa is pink and moist. Hearing is intact to voice. LYMPH NODES:  Lymph nodes in the neck are normal. RESPIRATORY:  Lungs are clear. There is normal respiratory effort, with equal breath sounds bilaterally, and without pathologic use of accessory muscles. CARDIOVASCULAR: Heart is regular without murmurs, gallops, or rubs. GI: The abdomen is  soft, nontender, and nondistended. There are no palpable masses. There is no hepatosplenomegaly. There are normal bowel sounds i.  Rectal: There is evidence of multiple pits superior to the gluteal cleft.  There is significant inflammation and induration and tenderness to palpation.  Exam is limited due to pain.  There is no evidence of necrotizing infection MUSCULOSKELETAL: Normal muscle strength and tone. No cyanosis or edema.   SKIN: Turgor is good and there are no pathologic skin lesions or ulcers. NEUROLOGIC: Motor and sensation is grossly normal. Cranial nerves are grossly intact. PSYCH:  Oriented to person, place and time. Affect is normal.  Data Reviewed  I have personally reviewed the patient's imaging, laboratory findings and medical records.    Assessment/Plan  20-year-old male   with persistent recurrent bilateral disease.  Discussed with patient in detail.  Given that is recurrent and is not responsive to antibiotic therapy therefore recommend excision.  Discussed with the patient in detail the procedure.  This will require him to be under general anesthetic and in prone position.  Specifically discussed with him that he will have to pack the wound for several weeks and we will have to let the wound heal from the inside out.  Risk of the operation, benefits and possible medications including but not  limited to: Bleeding, prolonged wound healing, chronic pain, recurrence were discussed with patient in detail he understands and wished to proceed.  He wants to do this as soon as possible since he is very uncomfortable.  We will go ahead and add him to the schedule tomorrow Copy of this report was sent to the referring provider Please note I spent 45 minutes in this encounter including personally reviewing medical records, counseling the patient, placing orders and performing appropriate documentation   Sterling Big, MD FACS General Surgeon 08/25/2022, 11:04 AM

## 2022-08-25 NOTE — H&P (View-Only) (Signed)
Patient ID: Jay Webb, male   DOB: 09-16-2001, 21 y.o.   MRN: [id number]  HPI Jay Webb is a 21 y.o. male seen in consultation at the request of Ms. Arlana Pouch nurse practitioner.  He presented to the urgent care 9 days ago complaining of gluteal pain and drainage.  The pain is mild to moderate intensity, sharp, intermittent and worsening when he sits.  He has had prior episodes of pilonidal disease and was Lancet.  Most recently 9 days ago I&D at the urgent care.  He was placed on Bactrim.  He endorses that his symptoms have not really improved he continues to have pain and discharge.  No fevers no chills. Does have significant family members with history of pilonidal disease Is able to perform more than 4 METS of activity without any shortness of breath or chest pain.  Last CBC and CMP was completely normal. He also had at some point time some evaluation by cardiology due to atypical chest pain and underwent stress echo that was completely normal. In the past he Did have a varicocele surgery and did well.    HPI  Past Medical History:  Diagnosis Date   ADHD 2014    Past Surgical History:  Procedure Laterality Date   VARICOCELE EXCISION      Family History  Problem Relation Age of Onset   Arthritis Mother    Hypertension Mother    Thyroid disease Mother    Stroke Maternal Grandfather 74    Social History Social History   Tobacco Use   Smoking status: Never   Smokeless tobacco: Never  Vaping Use   Vaping Use: Never used  Substance Use Topics   Alcohol use: Yes    Comment: less than monthly   Drug use: Not Currently    Allergies  Allergen Reactions   Other Itching    Seasonal, cats    No current outpatient medications on file.   No current facility-administered medications for this visit.     Review of Systems Full ROS  was asked and was negative except for the information on the HPI  Physical Exam Blood pressure 123/80, pulse 60, temperature 97.7 F  (36.5 C), temperature source Oral, height 5\' 11"  (1.803 m), weight 181 lb 3.2 oz (82.2 kg), SpO2 99 %. CONSTITUTIONAL: NAD. EYES: Pupils are equal, round,  Sclera are non-icteric. EARS, NOSE, MOUTH AND THROAT: The oropharynx is clear. The oral mucosa is pink and moist. Hearing is intact to voice. LYMPH NODES:  Lymph nodes in the neck are normal. RESPIRATORY:  Lungs are clear. There is normal respiratory effort, with equal breath sounds bilaterally, and without pathologic use of accessory muscles. CARDIOVASCULAR: Heart is regular without murmurs, gallops, or rubs. GI: The abdomen is  soft, nontender, and nondistended. There are no palpable masses. There is no hepatosplenomegaly. There are normal bowel sounds i.  Rectal: There is evidence of multiple pits superior to the gluteal cleft.  There is significant inflammation and induration and tenderness to palpation.  Exam is limited due to pain.  There is no evidence of necrotizing infection MUSCULOSKELETAL: Normal muscle strength and tone. No cyanosis or edema.   SKIN: Turgor is good and there are no pathologic skin lesions or ulcers. NEUROLOGIC: Motor and sensation is grossly normal. Cranial nerves are grossly intact. PSYCH:  Oriented to person, place and time. Affect is normal.  Data Reviewed  I have personally reviewed the patient's imaging, laboratory findings and medical records.    Assessment/Plan  21 year old male  with persistent recurrent bilateral disease.  Discussed with patient in detail.  Given that is recurrent and is not responsive to antibiotic therapy therefore recommend excision.  Discussed with the patient in detail the procedure.  This will require him to be under general anesthetic and in prone position.  Specifically discussed with him that he will have to pack the wound for several weeks and we will have to let the wound heal from the inside out.  Risk of the operation, benefits and possible medications including but not  limited to: Bleeding, prolonged wound healing, chronic pain, recurrence were discussed with patient in detail he understands and wished to proceed.  He wants to do this as soon as possible since he is very uncomfortable.  We will go ahead and add him to the schedule tomorrow Copy of this report was sent to the referring provider Please note I spent 45 minutes in this encounter including personally reviewing medical records, counseling the patient, placing orders and performing appropriate documentation   Sterling Big, MD FACS General Surgeon 08/25/2022, 11:04 AM

## 2022-08-25 NOTE — Telephone Encounter (Signed)
Patient has been advised of Pre-Admission date/time, and Surgery date at [facility].  Surgery Date: 08/26/22, add on for next day.    Preadmission Testing Date: 08/26/22 (arrive 2 hours early), patient already informed of arrival time and given instructions.

## 2022-08-25 NOTE — Patient Instructions (Addendum)
Our surgery scheduler Barbara will call you within 24-48 hours to get you scheduled. If you have not heard from her after 48 hours, please call our office. Have the blue sheet available when she calls to write down important information.    If you have any concerns or questions, please feel free to call our office.   Pilonidal Cyst Removal Pilonidal cyst removal is a procedure to remove a fluid-filled sac (cyst) that forms under the skin near the tailbone, at the top of the crease between the buttocks (pilonidal area). This procedure is also called a pilonidal cystectomy.  Pilonidal cyst is caused by an ingrown hair that irritates the area. Sometimes a tunnel (sinus) forms under the skin from the cyst and makes a second opening in the skin. In that case, the sinus area may also be removed during the procedure. You may need this procedure if you have a cyst that is large, painful, or keeps getting infected. A cyst that becomes infected is called an abscess. The abscess may need to be opened, drained, and treated with antibiotics before the cyst is removed. Tell your health care provider about: Any allergies you have. All medicines you are taking, including vitamins, herbs, eye drops, creams, and over-the-counter medicines. Any problems you or family members have had with anesthetic medicines. Any bleeding problems you have. Any surgeries you have had. Any medical conditions you have. Whether you are pregnant or may be pregnant. Any recent fever, increase in pain, or discharge from the cyst. What are the risks? Your health care provider will talk with you about risks. These may include: Delay in healing. This is the most common problem. Infection. Bleeding. Allergic reactions to medicines. A closed incision opening. The cyst coming back again (recurrence). What happens before the procedure? When to stop eating and drinking Follow instructions from your health care provider about what you  may eat and drink. These may include: 8 hours before your procedure Stop eating most foods. Do not eat meat, fried foods, or fatty foods. Eat only light foods, such as toast or crackers. All liquids are okay except energy drinks and alcohol. 6 hours before your procedure Stop eating. Drink only clear liquids, such as water, clear fruit juice, black coffee, plain tea, and sports drinks. Do not drink energy drinks or alcohol. 2 hours before your procedure Stop drinking all liquids. You may be allowed to take medicines with small sips of water. If you do not follow your health care provider's instructions, your procedure may be delayed or canceled. Medicines Ask your health care provider about: Changing or stopping your regular medicines. These include any diabetes medicines or blood thinners you take. Taking medicines such as aspirin and ibuprofen. These medicines can thin your blood. Do not take them unless your health care provider tells you to. Taking over-the-counter medicines, vitamins, herbs, and supplements. Surgery safety Ask your health care provider: How your surgery site will be marked. What steps will be taken to help prevent infection. These steps may include: Removing hair at the surgery site. Washing skin with a soap that kills germs. Taking antibiotics. General instructions Do not use any products that contain nicotine or tobacco for at least 4 weeks before the procedure. These products include cigarettes, chewing tobacco, and vaping devices, such as e-cigarettes. If you need help quitting, ask your health care provider. If you will be going home right after the procedure, plan to have a responsible adult: Take you home from the hospital or clinic.   You will not be allowed to drive. Care for you for the time you are told. You may need help with wound care and dressing changes. What happens during the procedure?  An IV will be inserted into one of your veins. You may be  given: A sedative. This helps you relax. Anesthesia. This will: Numb certain areas of your body. Make you fall asleep for surgery. Your surgeon will make an incision near the cyst. Depending on the size of the cyst and if the sinus is infected, one of the following will be done: If there is an abscess, a small hole will be made in the cyst. The pus will be drained out. If the sinus is large or keeps getting infected, your surgeon may: Cut out the sinus and remove some of the skin around it. The wound will be left open to heal on its own. Remove the sinus and cut out a flap on either side of it. The two sides will be stitched together. A thin, flexible tube with a camera (endoscope) may be used before this procedure to better see the area. The surgeon may remove hair and infected tissue. The sinus will then be cleaned with a solution. Heat will be used to seal the sinus. The incision may be left open or closed. An open incision may be packed with gauze and covered with a bandage (dressing). An incision may be closed with stitches (sutures) and covered with a dressing. The area may be sealed with fibrin glue and covered with a dressing. The procedure may vary among health care providers and hospitals. What happens after the procedure? Your blood pressure, heart rate, breathing rate, and blood oxygen level will be monitored until you leave the hospital or clinic. You will be given medicine for pain as needed. If you were given a sedative during the procedure, it can affect you for several hours. Do not drive or operate machinery until your health care provider says that it is safe. Your health care provider will give you instructions for taking care of your dressing at home after the procedure. If your incision was left open and packed with gauze, you will need to change your dressing every day. Summary Pilonidal cyst removal is surgery to remove a fluid-filled sac (cyst) that forms in the crease  between the buttocks. The incision used to remove the cyst may be closed with sutures or left open. If left open, it may be packed with gauze and covered with a dressing. You will be given medicine for pain as needed. Your health care provider will give you instructions for taking care of your dressing at home. This information is not intended to replace advice given to you by your health care provider. Make sure you discuss any questions you have with your health care provider. Document Revised: 03/05/2022 Document Reviewed: 03/05/2022 Elsevier Patient Education  2023 [employer]  

## 2022-08-26 ENCOUNTER — Other Ambulatory Visit: Payer: Self-pay

## 2022-08-26 ENCOUNTER — Ambulatory Visit: Payer: Commercial Managed Care - PPO | Admitting: Certified Registered Nurse Anesthetist

## 2022-08-26 ENCOUNTER — Encounter
Admission: RE | Disposition: A | Discharge status: Home / Self Care | Payer: Self-pay | Source: Home / Self Care | Attending: Surgery

## 2022-08-26 ENCOUNTER — Ambulatory Visit
Admission: RE | Admit: 2022-08-26 | Discharge: 2022-08-26 | Disposition: A | Discharge status: Home / Self Care | Payer: Commercial Managed Care - PPO | Attending: Surgery | Admitting: Surgery

## 2022-08-26 ENCOUNTER — Encounter: Payer: Self-pay | Admitting: Surgery

## 2022-08-26 DIAGNOSIS — L0501 Pilonidal cyst with abscess: Secondary | ICD-10-CM | POA: Insufficient documentation

## 2022-08-26 DIAGNOSIS — R361 Hematospermia: Secondary | ICD-10-CM

## 2022-08-26 DIAGNOSIS — L0591 Pilonidal cyst without abscess: Secondary | ICD-10-CM

## 2022-08-26 HISTORY — PX: PILONIDAL CYST EXCISION: SHX744

## 2022-08-26 SURGERY — EXCISION, PILONIDAL CYST, EXTENSIVE
Anesthesia: General | Site: Buttocks

## 2022-08-26 MED ORDER — MIDAZOLAM HCL 2 MG/2ML IJ SOLN
INTRAMUSCULAR | Status: AC
  Filled 2022-08-26: qty 2

## 2022-08-26 MED ORDER — CHLORHEXIDINE GLUCONATE 0.12 % MT SOLN
OROMUCOSAL | Status: AC
  Administered 2022-08-26: 15 mL via OROMUCOSAL
  Filled 2022-08-26: qty 15

## 2022-08-26 MED ORDER — KETOROLAC TROMETHAMINE 30 MG/ML IJ SOLN
INTRAMUSCULAR | Status: DC | PRN
  Administered 2022-08-26: 30 mg via INTRAVENOUS

## 2022-08-26 MED ORDER — SODIUM CHLORIDE 0.9 % IV SOLN
INTRAVENOUS | Status: AC
  Filled 2022-08-26: qty 2

## 2022-08-26 MED ORDER — SUCCINYLCHOLINE CHLORIDE 200 MG/10ML IV SOSY
PREFILLED_SYRINGE | INTRAVENOUS | Status: DC | PRN
  Administered 2022-08-26: 100 mg via INTRAVENOUS

## 2022-08-26 MED ORDER — ACETAMINOPHEN 10 MG/ML IV SOLN
INTRAVENOUS | Status: AC
  Filled 2022-08-26: qty 100

## 2022-08-26 MED ORDER — PROPOFOL 10 MG/ML IV BOLUS
INTRAVENOUS | Status: DC | PRN
  Administered 2022-08-26: 200 mg via INTRAVENOUS

## 2022-08-26 MED ORDER — BUPIVACAINE-EPINEPHRINE (PF) 0.25% -1:200000 IJ SOLN
INTRAMUSCULAR | Status: DC | PRN
  Administered 2022-08-26: 50 mL via INTRAMUSCULAR

## 2022-08-26 MED ORDER — STERILE WATER FOR IRRIGATION IR SOLN
Status: DC | PRN
  Administered 2022-08-26: 500 mL

## 2022-08-26 MED ORDER — HYDROCODONE-ACETAMINOPHEN 5-325 MG PO TABS: 1.0000 | ORAL_TABLET | ORAL | 0 refills | Status: DC | PRN

## 2022-08-26 MED ORDER — LACTATED RINGERS IV SOLN: INTRAVENOUS | Status: DC

## 2022-08-26 MED ORDER — MIDAZOLAM HCL 2 MG/2ML IJ SOLN
INTRAMUSCULAR | Status: DC | PRN
  Administered 2022-08-26: 2 mg via INTRAVENOUS

## 2022-08-26 MED ORDER — ORAL CARE MOUTH RINSE: 15.0000 mL | Freq: Once | OROMUCOSAL | Status: AC

## 2022-08-26 MED ORDER — OXYCODONE HCL 5 MG/5ML PO SOLN: 5.0000 mg | ORAL | 0 refills | Status: DC | PRN

## 2022-08-26 MED ORDER — CHLORHEXIDINE GLUCONATE 0.12 % MT SOLN: 15.0000 mL | Freq: Once | OROMUCOSAL | Status: AC

## 2022-08-26 MED ORDER — FENTANYL CITRATE (PF) 250 MCG/5ML IJ SOLN
INTRAMUSCULAR | Status: AC
  Filled 2022-08-26: qty 5

## 2022-08-26 MED ORDER — ONDANSETRON HCL 4 MG/2ML IJ SOLN
INTRAMUSCULAR | Status: DC | PRN
  Administered 2022-08-26: 4 mg via INTRAVENOUS

## 2022-08-26 MED ORDER — BUPIVACAINE LIPOSOME 1.3 % IJ SUSP
INTRAMUSCULAR | Status: AC
  Filled 2022-08-26: qty 20

## 2022-08-26 MED ORDER — FENTANYL CITRATE (PF) 100 MCG/2ML IJ SOLN: 25.0000 ug | INTRAMUSCULAR | Status: DC | PRN

## 2022-08-26 MED ORDER — DEXMEDETOMIDINE HCL IN NACL 80 MCG/20ML IV SOLN
INTRAVENOUS | Status: DC | PRN
  Administered 2022-08-26: 20 ug via BUCCAL

## 2022-08-26 MED ORDER — LIDOCAINE HCL (CARDIAC) PF 100 MG/5ML IV SOSY
PREFILLED_SYRINGE | INTRAVENOUS | Status: DC | PRN
  Administered 2022-08-26: 100 mg via INTRAVENOUS

## 2022-08-26 MED ORDER — ACETAMINOPHEN 500 MG PO TABS
ORAL_TABLET | ORAL | Status: AC
  Filled 2022-08-26: qty 2

## 2022-08-26 MED ORDER — ACETAMINOPHEN 10 MG/ML IV SOLN
INTRAVENOUS | Status: DC | PRN
  Administered 2022-08-26: 1000 mg via INTRAVENOUS

## 2022-08-26 MED ORDER — OXYCODONE HCL 5 MG/5ML PO SOLN: 5.0000 mg | ORAL | Status: DC | PRN

## 2022-08-26 MED ORDER — DEXAMETHASONE SODIUM PHOSPHATE 10 MG/ML IJ SOLN
INTRAMUSCULAR | Status: DC | PRN
  Administered 2022-08-26: 5 mg via INTRAVENOUS

## 2022-08-26 MED ORDER — GABAPENTIN 300 MG PO CAPS
ORAL_CAPSULE | ORAL | Status: AC
  Filled 2022-08-26: qty 1

## 2022-08-26 MED ORDER — FENTANYL CITRATE (PF) 100 MCG/2ML IJ SOLN
INTRAMUSCULAR | Status: DC | PRN
  Administered 2022-08-26: 50 ug via INTRAVENOUS
  Administered 2022-08-26: 100 ug via INTRAVENOUS
  Administered 2022-08-26: 50 ug via INTRAVENOUS

## 2022-08-26 MED ORDER — CELECOXIB 200 MG PO CAPS
ORAL_CAPSULE | ORAL | Status: AC
  Filled 2022-08-26: qty 1

## 2022-08-26 SURGICAL SUPPLY — 24 items
ADHESIVE MASTISOL STRL (MISCELLANEOUS) ×1 IMPLANT
BANDAGE GAUZE 1X75IN STRL (MISCELLANEOUS) ×1 IMPLANT
BLADE CLIPPER SURG (BLADE) ×1 IMPLANT
BLADE SURG 15 STRL LF DISP TIS (BLADE) ×1 IMPLANT
BLADE SURG 15 STRL SS (BLADE) ×1
BNDG GAUZE 1X75IN STRL (MISCELLANEOUS) ×1
CHLORAPREP W/TINT 26 (MISCELLANEOUS) ×1 IMPLANT
DRAPE LAPAROTOMY 77X122 PED (DRAPES) ×1 IMPLANT
ELECT REM PT RETURN 9FT ADLT (ELECTROSURGICAL) ×1
ELECTRODE REM PT RTRN 9FT ADLT (ELECTROSURGICAL) ×1 IMPLANT
GAUZE 4X4 16PLY ~~LOC~~+RFID DBL (SPONGE) ×1 IMPLANT
GLOVE BIO SURGEON STRL SZ7 (GLOVE) ×1 IMPLANT
GOWN STRL REUS W/ TWL LRG LVL3 (GOWN DISPOSABLE) ×2 IMPLANT
GOWN STRL REUS W/TWL LRG LVL3 (GOWN DISPOSABLE) ×1
MANIFOLD NEPTUNE II (INSTRUMENTS) ×1 IMPLANT
NEEDLE HYPO 22GX1.5 SAFETY (NEEDLE) ×1 IMPLANT
PACK BASIN MINOR ARMC (MISCELLANEOUS) ×1 IMPLANT
PAD ABD DERMACEA PRESS 5X9 (GAUZE/BANDAGES/DRESSINGS) ×1 IMPLANT
SPONGE T-LAP 18X18 ~~LOC~~+RFID (SPONGE) ×1 IMPLANT
SWAB CULTURE AMIES ANAERIB BLU (MISCELLANEOUS) ×1 IMPLANT
SYR BULB IRRIG 60ML STRL (SYRINGE) ×1 IMPLANT
TAPE CLOTH 3X10 WHT NS LF (GAUZE/BANDAGES/DRESSINGS) ×1 IMPLANT
TRAP FLUID SMOKE EVACUATOR (MISCELLANEOUS) ×1 IMPLANT
WATER STERILE IRR 500ML POUR (IV SOLUTION) ×1 IMPLANT

## 2022-08-26 NOTE — Anesthesia Preprocedure Evaluation (Signed)
Anesthesia Evaluation  Patient identified by MRN, date of birth, ID band Patient awake    Reviewed: Allergy & Precautions, NPO status , Patient's Chart, lab work & pertinent test results  History of Anesthesia Complications Negative for: history of anesthetic complications  Airway Mallampati: I  TM Distance: >3 FB Neck ROM: Full    Dental no notable dental hx.    Pulmonary neg pulmonary ROS,    Pulmonary exam normal breath sounds clear to auscultation       Cardiovascular negative cardio ROS Normal cardiovascular exam Rhythm:Regular Rate:Normal     Neuro/Psych negative neurological ROS  negative psych ROS   GI/Hepatic negative GI ROS, Neg liver ROS,   Endo/Other  negative endocrine ROS  Renal/GU negative Renal ROS  negative genitourinary   Musculoskeletal negative musculoskeletal ROS (+)   Abdominal   Peds negative pediatric ROS (+)  Hematology negative hematology ROS (+)   Anesthesia Other Findings   Reproductive/Obstetrics negative OB ROS                             Anesthesia Physical Anesthesia Plan  ASA: 1  Anesthesia Plan: General   Post-op Pain Management: Minimal or no pain anticipated   Induction: Intravenous  PONV Risk Score and Plan: 2 and Ondansetron, Dexamethasone, Midazolam and Treatment may vary due to age or medical condition  Airway Management Planned: Natural Airway and Nasal Cannula  Additional Equipment:   Intra-op Plan:   Post-operative Plan: Extubation in OR  Informed Consent: I have reviewed the patients History and Physical, chart, labs and discussed the procedure including the risks, benefits and alternatives for the proposed anesthesia with the patient or authorized representative who has indicated his/her understanding and acceptance.     Dental Advisory Given  Plan Discussed with: Anesthesiologist, CRNA and Surgeon  Anesthesia Plan  Comments: (Patient consented for risks of anesthesia including but not limited to:  - adverse reactions to medications - damage to eyes, teeth, lips or other oral mucosa - nerve damage due to positioning  - sore throat or hoarseness - Damage to heart, brain, nerves, lungs, other parts of body or loss of life  Patient voiced understanding.)        Anesthesia Quick Evaluation

## 2022-08-26 NOTE — Transfer of Care (Signed)
Immediate Anesthesia Transfer of Care Note  Patient: Jay Webb  Procedure(s) Performed: CYST EXCISION PILONIDAL EXTENSIVE (Buttocks)  Patient Location: PACU  Anesthesia Type:General  Level of Consciousness: awake, drowsy and patient cooperative  Airway & Oxygen Therapy: Patient Spontanous Breathing and Patient connected to face mask oxygen  Post-op Assessment: Report given to RN, Post -op Vital signs reviewed and stable and Patient moving all extremities  Post vital signs: Reviewed and stable  Last Vitals:  Vitals Value Taken Time  BP 131/61 08/26/22 1151  Temp    Pulse 94 08/26/22 1155  Resp 20 08/26/22 1155  SpO2 100 % 08/26/22 1155  Vitals shown include unvalidated device data.  Last Pain:  Vitals:   08/26/22 1024  TempSrc: Temporal  PainSc: 0-No pain         Complications: No notable events documented.

## 2022-08-26 NOTE — Anesthesia Postprocedure Evaluation (Signed)
Anesthesia Post Note  Patient: Jay Webb  Procedure(s) Performed: CYST EXCISION PILONIDAL EXTENSIVE (Buttocks)  Patient location during evaluation: PACU Anesthesia Type: General Level of consciousness: awake and alert Pain management: pain level controlled Vital Signs Assessment: post-procedure vital signs reviewed and stable Respiratory status: spontaneous breathing, nonlabored ventilation, respiratory function stable and patient connected to nasal cannula oxygen Cardiovascular status: blood pressure returned to baseline and stable Postop Assessment: no apparent nausea or vomiting Anesthetic complications: no   No notable events documented.   Last Vitals:  Vitals:   08/26/22 1225 08/26/22 1238  BP:  121/62  Pulse: 72 68  Resp: 17 18  Temp:  36.6 C  SpO2: 99% 100%    Last Pain:  Vitals:   08/26/22 1238  TempSrc: Temporal  PainSc: 0-No pain                 Louie Boston

## 2022-08-26 NOTE — Anesthesia Procedure Notes (Signed)
Procedure Name: Intubation Date/Time: 08/26/2022 10:56 AM  Performed by: Henrietta Hoover, CRNAPre-anesthesia Checklist: Patient identified, Emergency Drugs available, Suction available and Patient being monitored Patient Re-evaluated:Patient Re-evaluated prior to induction Oxygen Delivery Method: Circle system utilized Preoxygenation: Pre-oxygenation with 100% oxygen Induction Type: IV induction Ventilation: Mask ventilation without difficulty Laryngoscope Size: McGraph and 4 Grade View: Grade I Tube type: Oral Tube size: 7.0 mm Number of attempts: 1 Airway Equipment and Method: Stylet and Video-laryngoscopy Placement Confirmation: ETT inserted through vocal cords under direct vision, positive ETCO2 and breath sounds checked- equal and bilateral Secured at: 22 cm Tube secured with: Tape Dental Injury: Teeth and Oropharynx as per pre-operative assessment

## 2022-08-26 NOTE — Op Note (Signed)
  08/26/2022  11:32 AM  PATIENT:  Jay Webb  21 y.o. male  PRE-OPERATIVE DIAGNOSIS:  Pilonidal abscess  POST-OPERATIVE DIAGNOSIS:  Same  PROCEDURE: 1.Excision of complicated and extensive pilonidal cyst 2. Excisional debridement of skin subcutaneous tissue and muscle measuring 22.5 square centimeters 5 x 4.5cm    SURGEON:  Surgeon(s) and Role:    * Glyndon Tursi F, MD - Primary  FINDINGS: complicated and extensive pilonidal cyst with multiple pits and tracks  ANESTHESIA: GETA   DICTATION:  Patient was explained  about he  procedure in detail; riska, benefits and  possible complications and a consent was obtained. The patient taken to the operating room and placed in the prone position. Elliptical Incision was created and pus was poured out , this was drained and cultured. There were complex luculations  and multiple pits ,  we were able to excised the pits with electrocautery. All the loculations were broken down.  Pilonidal disease was extensive and extended into the fascia of the coccyx. We also removed a ball of hair that was causing significant inflammatory response. Using a sharp curette we debrided the sub q tissue down to the muscle to include fascia. Hemostasis was obtained with electrocautery. Irrigation with normal saline and the wound was packed with half-inch packing. Liposomal Marcaine was injected around the wound site. Needle and laparotomy counts were correct and there were no immediate complications  Leafy Ro, MD

## 2022-08-26 NOTE — Discharge Instructions (Addendum)
Pilonidal Cyst Drainage, Care After The following information offers guidance on how to care for yourself after your procedure. Your health care provider may also give you more specific instructions. If you have problems or questions, contact your health care provider. What can I expect after the procedure? After the procedure, it is common to have: Pain that gets better when you take medicine. Some fluid or blood coming from your wound. Follow these instructions at home: Medicines Take over-the-counter and prescription medicines only as told by your health care provider. If you were prescribed antibiotics, take them as told by your health care provider. Do not stop using the antibiotic even if you start to feel better. Ask your health care provider if the medicine prescribed to you: Requires you to avoid driving or using machinery. Can cause constipation. You may need to take these actions to prevent or treat constipation: Drink enough fluid to keep your urine pale yellow. Take over-the-counter or prescription medicines. Eat foods that are high in fiber, such as beans, whole grains, and fresh fruits and vegetables. Limit foods that are high in fat and processed sugars, such as fried or sweet foods. Bathing Do not take baths or showers, swim, or use a hot tub until your health care provider approves. You may only be allowed to take sponge baths. When you can return to bathing will depend on the type of wound that you have. If your wound was packed with a germ-free packing material, keep the area dry until your packing has been removed. After the packing has been removed, you may start taking showers when your health care provider approves. If the edges of the incision around your wound were stitched to your skin (marsupialization), you may start taking showers the day after surgery, or when your health care provider approves. Let the water from the shower moisten your bandage (dressing). This  will make it easier to take off. Remove your dressing before you shower. While bathing, clean your buttocks area gently with soap and water. After bathing: Pat the area dry with a soft, clean towel. If directed, cover the area with a clean dressing. Wound care Two stitched wounds. One is normal. The other is red with pus and infected.   You may need to have a caregiver help you with wound care and dressing changes. Follow instructions from your health care provider about how to take care of your wound. Make sure you: Wash your hands with soap and water for at least 20 seconds before and after you change your dressing. If soap and water are not available, use hand sanitizer. Change your dressing as told by your health care provider. ( Packing wound daily) Check your wound every day for signs of infection. Check for: Redness, swelling, or more pain. More fluid or blood. Warmth. Pus  Follow any additional instructions from your health care provider on how to care for your wound, such as wound cleaning, wound flushing (irrigation), or packing your wound with a dressing. If you had marsupialization, ask your health care provider when you can stop using a dressing. Lifestyle Do not do activities that irritate or put pressure on your buttocks for about 2 weeks, or as long as told by your health care provider. These activities include bike riding, running, and anything that involves a twisting motion. Rest as told by your health care provider. Do not sit for a long time without moving. Get up to take short walks every 1-2 hours. This will improve blood  flow and breathing. Ask for help if you feel weak or unsteady. Sleep on your side instead of your back. Avoid wearing tight underwear and tight pants. Return to your normal activities as told by your health care provider. Ask your health care provider what activities are safe for you. General instructions Do not use any products that contain  nicotine or tobacco. These products include cigarettes, chewing tobacco, and vaping devices, such as e-cigarettes. These can delay wound healing after surgery. If you need help quitting, ask your health care provider. Keep all follow-up visits. This is important to monitor healing. If you had an incision and drainage procedure with wound packing, your packing may be changed or removed at follow-up visits. Contact a health care provider if: You have pain that does not get better with medicine. You have any of these signs of infection: More redness, swelling, or pain around your incision. More fluid or blood coming from your incision. Warmth coming from your incision. Pus or a bad smell coming from your incision. A fever. You have muscle aches. You feel generally sick. You are dizzy. Summary A pilonidal cyst is a fluid-filled sac that forms under the skin near the tailbone. It is common to have some fluid or blood coming from your wound after a procedure to drain a pilonidal cyst. If you were prescribed antibiotics, take them as told by your health care provider. Do not stop taking the antibiotic even if you start to feel better. You may need to have a caregiver help you with wound care and dressing changes. Return to your health care provider as instructed to have any packing material changed or removed. This information is not intended to replace advice given to you by your health care provider. Make sure you discuss any questions you have with your health care provider.   AMBULATORY SURGERY  DISCHARGE INSTRUCTIONS   The drugs that you were given will stay in your system until tomorrow so for the next 24 hours you should not:  Drive an automobile Make any legal decisions Drink any alcoholic beverage   You may resume regular meals tomorrow.  Today it is better to start with liquids and gradually work up to solid foods.  You may eat anything you prefer, but it is better to start with  liquids, then soup and crackers, and gradually work up to solid foods.   Please notify your doctor immediately if you have any unusual bleeding, trouble breathing, redness and pain at the surgery site, drainage, fever, or pain not relieved by medication.    Additional Instructions:   Please contact your physician with any problems or Same Day Surgery at [phone], Monday through Friday 6 am to 4 pm, or [place] at [facility] number at [phone].

## 2022-08-26 NOTE — Interval H&P Note (Signed)
History and Physical Interval Note:  08/26/2022 10:22 AM  Jay Webb  has presented today for surgery, with the diagnosis of Pilonidal cyst.  The various methods of treatment have been discussed with the patient and family. After consideration of risks, benefits and other options for treatment, the patient has consented to  Procedure(s): CYST EXCISION PILONIDAL EXTENSIVE (N/A) as a surgical intervention.  The patient's history has been reviewed, patient examined, no change in status, stable for surgery.  I have reviewed the patient's chart and labs.  Questions were answered to the patient's satisfaction.     Sunya Humbarger F Marny Smethers

## 2022-08-27 ENCOUNTER — Encounter: Payer: Self-pay | Admitting: Surgery

## 2022-08-27 LAB — SURGICAL PATHOLOGY

## 2022-08-30 LAB — AEROBIC/ANAEROBIC CULTURE W GRAM STAIN (SURGICAL/DEEP WOUND)

## 2022-09-09 ENCOUNTER — Ambulatory Visit (INDEPENDENT_AMBULATORY_CARE_PROVIDER_SITE_OTHER): Payer: Commercial Managed Care - PPO | Admitting: Physician Assistant

## 2022-09-09 ENCOUNTER — Encounter: Payer: Commercial Managed Care - PPO | Admitting: Physician Assistant

## 2022-09-09 ENCOUNTER — Encounter: Payer: Self-pay | Admitting: Physician Assistant

## 2022-09-09 VITALS — BP 114/77 | HR 59 | Temp 97.6°F | Wt 184.0 lb

## 2022-09-09 DIAGNOSIS — L0591 Pilonidal cyst without abscess: Secondary | ICD-10-CM

## 2022-09-09 NOTE — Patient Instructions (Signed)
If you have any concerns or questions, please feel free to call our office.   Pilonidal Cyst Removal, Care After The following information offers guidance on how to care for yourself after your procedure. Your health care provider may also give you more specific instructions. If you have problems or questions, contact your health care provider. What can I expect after the procedure? After the procedure, it is common to have: Pain. Redness. Some swelling. Some fluid or blood coming from your incision (drainage). You may have more drainage if you have an open incision. Follow these instructions at home: Medicines Take over-the-counter and prescription medicines only as told by your health care provider. If you were prescribed antibiotics, take them as told by your health care provider. Do not stop using the antibiotic even if you start to feel better. Ask your health care provider if the medicine prescribed to you: Requires you to avoid driving or using machinery. Can cause constipation. You may need to take these actions to prevent or treat constipation: Drink enough fluid to keep your urine pale yellow. Take over-the-counter or prescription medicines. Eat foods that are high in fiber, such as beans, whole grains, and fresh fruits and vegetables. Limit foods that are high in fat and processed sugars, such as fried or sweet foods. Incision care  You may need to have a caregiver help you with wound care and dressing changes. Follow instructions from your health care provider about how to take care of your incision. Make sure you: Wash your hands with soap and water for at least 20 seconds before and after you change your bandage (dressing). If soap and water are not available, use hand sanitizer. Change your dressing as told by your health care provider. Leave stitches (sutures), skin glue, or adhesive strips in place. These skin closures may need to stay in place for 2 weeks or longer. If  adhesive strip edges start to loosen and curl up, you may trim the loose edges. Do not remove adhesive strips completely unless your health care provider tells you to do that. Check your incision area every day for signs of infection. If it is hard to see the area, have someone check for you. Check for: More redness, swelling, or pain. More fluid or blood. Warmth. Pus or a bad smell. Managing pain and swelling If directed, put ice on the affected area. To do this: Put ice in a plastic bag. Place a towel between your skin and the bag. Leave the ice on for 20 minutes, 2-3 times a day. If your skin turns bright red, remove the ice right away to prevent skin damage. The risk of skin damage is higher if you cannot feel pain, heat, or cold. Activity Do not do activities that cause pain or irritate the incision area. These may include bike riding, running, sit ups, or anything that involves a twisting motion. Rest as told by your health care provider. Do not sit for a long time without moving. Get up to take short walks every 1-2 hours. This will improve blood flow and breathing. Ask for help if you feel weak or unsteady. Return to your normal activities as told by your health care provider. Ask your health care provider what activities are safe for you. General instructions Do not use any products that contain nicotine or tobacco. These products include cigarettes, chewing tobacco, and vaping devices, such as e-cigarettes. These can delay healing after surgery. If you need help quitting, ask your health care provider.  Do not take baths, swim, or use a hot tub until your health care provider approves. Ask your health care provider if you may take showers. You may only be allowed to take sponge baths. Keep all follow-up visits. This is important to monitor healing. If you had a procedure with wound packing, your packing may be changed or removed at follow-up visits. Contact a health care provider  if: You have pain that does not get better with medicine. You have any of these signs of infection: More redness, swelling, or pain around your incision. More fluid or blood coming from your incision. Warmth coming from your incision. Pus or a bad smell coming from your incision. A fever. Get help right away if: You have severe pain in your abdomen. You have sudden chest pain and shortness of breath. You cough up blood. You faint or lose consciousness. These symptoms may be an emergency. Get help right away. Call 911. Do not wait to see if the symptoms will go away. Do not drive yourself to the hospital. Summary It is common to have some pain and drainage after your procedure. You may have more drainage if you have an open incision. You may need to have a caregiver help you with wound care and dressing changes. Do not do activities that cause pain or irritate the incision area. Contact your health care provider if you have pain that does not get better with medicine or if you have any signs of infection. This information is not intended to replace advice given to you by your health care provider. Make sure you discuss any questions you have with your health care provider. Document Revised: 03/05/2022 Document Reviewed: 03/05/2022 Elsevier Patient Education  [place].

## 2022-09-09 NOTE — Progress Notes (Signed)
[facility] POST-OP OFFICE VISIT  09/09/2022  HPI: Jay Webb is a 21 y.o. male 2 weeks s/p excision of pilonidal cyst.  He has been doing daily dressing changes since his discharge.  Reports showering with each dressing change.  Has good help with his parents assisting in his wound care.  No complaints, no fevers no chills, no nausea or vomiting.  Regular bowel activity.  He rates his pain at a 6.  Vital signs: BP 114/77   Pulse (!) 59   Temp 97.6 F (36.4 C) (Oral)   Wt 184 lb (83.5 kg)   SpO2 98%   BMI 24.95 kg/m    Physical Exam: Constitutional: He appears well  Skin: [place] cleft incision is clean there is no evidence of induration or erythema.  Remove the old packing with good bleeding edges from the skin and subcutaneous tissues.  Slight discoloration in the depth, from a little bit of fibrinous exudate.  Encouraged him to continue showering with his dressing changes and continue to pack deeply.  He will do this daily and follow-up with [place] for wound evaluation.  Assessment/Plan: This is a 21 y.o. male 2 weeks s/p pilonidal cyst excision.  Patient Active Problem List   Diagnosis Date Noted   Hemospermia 07/05/2022   Hematuria 04/22/2021   Chronic recurrent pilonidal cyst without abscess 07/30/2020   Viral URI with cough 09/07/2019   ADHD (attention deficit hyperactivity disorder), inattentive type 06/07/2019   Heart murmur 06/07/2019   Atypical chest pain 06/07/2019    -Continue wound care, emphasis on healing from the inside out and keeping his wound open.  Follow-up as scheduled.   Ronny Bacon M.D., FACS 09/09/2022, 9:23 AM

## 2022-09-30 ENCOUNTER — Encounter: Payer: Self-pay | Admitting: Physician Assistant

## 2022-09-30 ENCOUNTER — Ambulatory Visit (INDEPENDENT_AMBULATORY_CARE_PROVIDER_SITE_OTHER): Payer: Commercial Managed Care - PPO | Admitting: Physician Assistant

## 2022-09-30 VITALS — BP 113/79 | HR 67 | Temp 98.3°F | Wt 186.4 lb

## 2022-09-30 DIAGNOSIS — L0591 Pilonidal cyst without abscess: Secondary | ICD-10-CM

## 2022-09-30 DIAGNOSIS — Z09 Encounter for follow-up examination after completed treatment for conditions other than malignant neoplasm: Secondary | ICD-10-CM

## 2022-09-30 NOTE — Progress Notes (Signed)
[facility] POST-OP OFFICE VISIT  09/30/2022  HPI: Jay Webb is a 21 y.o. male ~1 month days s/p pilonidal cyst excision and debridement with Dr Dahlia Byes  Overall doing well On Tuesday, he thought he noticed a change in drainage from the wound, described as thick and yellow, and was concerned This was isolate to that day and has not occurred since No fever, chills, or significant erythema Still packing but using less; parents are helping with this.  No other complaints   Vital signs: BP 113/79   Pulse 67   Temp 98.3 F (36.8 C) (Oral)   Wt 186 lb 6.4 oz (84.6 kg)   SpO2 97%   BMI 25.28 kg/m    Physical Exam: Constitutional: Well appearing male, NAD Skin: Jay Webb present to chaperone, pilonidal incision is healing well. Centrally there is approximately 2 x 1 cm area still closing via secondary intention, wound bed is entirely 100% granulation tissue, no erythema, no drainage  Assessment/Plan: This is a 21 y.o. male ~1 month days s/p pilonidal cyst excision and debridement with Dr Dahlia Byes   - No evidence of infection; no need for ABx  - Continue packing wound daily; doing well at home   - I will see him again in 3-4 weeks for wound checks; He understands to call with questions/concerns in the interim   -- Edison Simon, PA-C [facility] 09/30/2022, 4:08 PM M-F: 7am - 4pm

## 2022-09-30 NOTE — Patient Instructions (Signed)
If you have any concerns or questions, please feel free to call our office. See follow up appointment below.   Pilonidal Cyst Removal, Care After The following information offers guidance on how to care for yourself after your procedure. Your health care provider may also give you more specific instructions. If you have problems or questions, contact your health care provider. What can I expect after the procedure? After the procedure, it is common to have: Pain. Redness. Some swelling. Some fluid or blood coming from your incision (drainage). You may have more drainage if you have an open incision. Follow these instructions at home: Medicines Take over-the-counter and prescription medicines only as told by your health care provider. If you were prescribed antibiotics, take them as told by your health care provider. Do not stop using the antibiotic even if you start to feel better. Ask your health care provider if the medicine prescribed to you: Requires you to avoid driving or using machinery. Can cause constipation. You may need to take these actions to prevent or treat constipation: Drink enough fluid to keep your urine pale yellow. Take over-the-counter or prescription medicines. Eat foods that are high in fiber, such as beans, whole grains, and fresh fruits and vegetables. Limit foods that are high in fat and processed sugars, such as fried or sweet foods. Incision care  You may need to have a caregiver help you with wound care and dressing changes. Follow instructions from your health care provider about how to take care of your incision. Make sure you: Wash your hands with soap and water for at least 20 seconds before and after you change your bandage (dressing). If soap and water are not available, use hand sanitizer. Change your dressing as told by your health care provider. Leave stitches (sutures), skin glue, or adhesive strips in place. These skin closures may need to stay in  place for 2 weeks or longer. If adhesive strip edges start to loosen and curl up, you may trim the loose edges. Do not remove adhesive strips completely unless your health care provider tells you to do that. Check your incision area every day for signs of infection. If it is hard to see the area, have someone check for you. Check for: More redness, swelling, or pain. More fluid or blood. Warmth. Pus or a bad smell. Managing pain and swelling If directed, put ice on the affected area. To do this: Put ice in a plastic bag. Place a towel between your skin and the bag. Leave the ice on for 20 minutes, 2-3 times a day. If your skin turns bright red, remove the ice right away to prevent skin damage. The risk of skin damage is higher if you cannot feel pain, heat, or cold. Activity Do not do activities that cause pain or irritate the incision area. These may include bike riding, running, sit ups, or anything that involves a twisting motion. Rest as told by your health care provider. Do not sit for a long time without moving. Get up to take short walks every 1-2 hours. This will improve blood flow and breathing. Ask for help if you feel weak or unsteady. Return to your normal activities as told by your health care provider. Ask your health care provider what activities are safe for you. General instructions Do not use any products that contain nicotine or tobacco. These products include cigarettes, chewing tobacco, and vaping devices, such as e-cigarettes. These can delay healing after surgery. If you need help quitting,  ask your health care provider. Do not take baths, swim, or use a hot tub until your health care provider approves. Ask your health care provider if you may take showers. You may only be allowed to take sponge baths. Keep all follow-up visits. This is important to monitor healing. If you had a procedure with wound packing, your packing may be changed or removed at follow-up  visits. Contact a health care provider if: You have pain that does not get better with medicine. You have any of these signs of infection: More redness, swelling, or pain around your incision. More fluid or blood coming from your incision. Warmth coming from your incision. Pus or a bad smell coming from your incision. A fever. Get help right away if: You have severe pain in your abdomen. You have sudden chest pain and shortness of breath. You cough up blood. You faint or lose consciousness. These symptoms may be an emergency. Get help right away. Call 911. Do not wait to see if the symptoms will go away. Do not drive yourself to the hospital. Summary It is common to have some pain and drainage after your procedure. You may have more drainage if you have an open incision. You may need to have a caregiver help you with wound care and dressing changes. Do not do activities that cause pain or irritate the incision area. Contact your health care provider if you have pain that does not get better with medicine or if you have any signs of infection. This information is not intended to replace advice given to you by your health care provider. Make sure you discuss any questions you have with your health care provider. Document Revised: 03/05/2022 Document Reviewed: 03/05/2022 Elsevier Patient Education  2023 [employer]  

## 2022-10-26 ENCOUNTER — Encounter: Payer: Commercial Managed Care - PPO | Admitting: Physician Assistant

## 2022-10-28 ENCOUNTER — Ambulatory Visit (INDEPENDENT_AMBULATORY_CARE_PROVIDER_SITE_OTHER): Payer: Commercial Managed Care - PPO | Admitting: Physician Assistant

## 2022-10-28 ENCOUNTER — Encounter: Payer: Self-pay | Admitting: Physician Assistant

## 2022-10-28 ENCOUNTER — Other Ambulatory Visit: Payer: Self-pay

## 2022-10-28 VITALS — BP 115/69 | HR 56 | Temp 97.9°F | Ht 72.0 in | Wt 182.0 lb

## 2022-10-28 DIAGNOSIS — Z09 Encounter for follow-up examination after completed treatment for conditions other than malignant neoplasm: Secondary | ICD-10-CM

## 2022-10-28 DIAGNOSIS — L0591 Pilonidal cyst without abscess: Secondary | ICD-10-CM

## 2022-10-28 NOTE — Progress Notes (Signed)
[facility] POST-OP OFFICE VISIT  10/28/2022  HPI: Jay Webb is a 21 y.o. male ~2 months s/p pilonidal cyst excision and debridement with Dr Everlene Farrier   Overall doing well Still packing a small portion of the wound No drainage No fever, chills  Vital signs: BP 115/69   Pulse (!) 56   Temp 97.9 F (36.6 C) (Oral)   Ht 6' (1.829 m)   Wt 182 lb (82.6 kg)   SpO2 97%   BMI 24.68 kg/m    Physical Exam: Constitutional: Well appearing male, NAD Skin: Velna Hatchet present to chaperone, pilonidal incision is healing well. Centrally there is approximately 1 x 1 cm area still closing via secondary intention, wound bed is entirely 100% granulation tissue, no erythema, no drainage   Assessment/Plan: This is a 21 y.o. male ~2 months s/p pilonidal cyst excision and debridement with Dr Everlene Farrier    - I applied silver nitrate to the wound bed in effort to hopefully facilitate scarring and closer of this area  - I believe we can transition to superficial dressings as need now as well. The packing, although family is doing a good job, may be dealing this wounds ability to close.   - He continues to do well at home and is a very reliable patient. He will follow up on as needed basis and understands he can call at any time with questions/concerns   -- Lynden Oxford, PA-C [facility] 10/28/2022, 3:45 PM M-F: 7am - 4pm

## 2024-07-20 ENCOUNTER — Ambulatory Visit
Admission: EM | Admit: 2024-07-20 | Discharge: 2024-07-20 | Disposition: A | Discharge status: Home / Self Care | Attending: Emergency Medicine | Admitting: Emergency Medicine

## 2024-07-20 DIAGNOSIS — J069 Acute upper respiratory infection, unspecified: Secondary | ICD-10-CM | POA: Diagnosis not present

## 2024-07-20 LAB — POCT RAPID STREP A (OFFICE): Rapid Strep A Screen: NEGATIVE

## 2024-07-20 LAB — POC SOFIA SARS ANTIGEN FIA: SARS Coronavirus 2 Ag: NEGATIVE

## 2024-07-20 NOTE — ED Provider Notes (Signed)
 CAY RALPH PELT    CSN: [id number] Arrival date & time: 07/20/24  9052      History   Chief Complaint Chief Complaint  Patient presents with   Fever    HPI Jay Webb is a 23 y.o. male.  Patient presents with subjective fever, runny nose, congestion, sore throat, cough x 3 days.  No OTC medication taken today but previously treating symptoms with TheraFlu and NyQuil.  No shortness of breath, vomiting, diarrhea.  The history is provided by the patient and medical records.    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   Hematuria 04/22/2021   Chronic recurrent pilonidal cyst without abscess 07/30/2020   Viral URI with cough 09/07/2019   ADHD (attention deficit hyperactivity disorder), inattentive type 06/07/2019   Heart murmur 06/07/2019   Atypical chest pain 06/07/2019    Past Surgical History:  Procedure Laterality Date   PILONIDAL CYST EXCISION N/A 08/26/2022   Procedure: CYST EXCISION PILONIDAL EXTENSIVE;  Surgeon: Jordis Laneta FALCON, MD;  Location: [facility] ORS;  Service: General;  Laterality: N/A;   VARICOCELE EXCISION         Home Medications    Prior to Admission medications   Not on File    Family History Family History  Problem Relation Age of Onset   Arthritis Mother    Hypertension Mother    Thyroid  disease Mother    Stroke Maternal Grandfather 45    Social History Social History   Tobacco Use   Smoking status: Never   Smokeless tobacco: Never  Vaping Use   Vaping status: Never Used  Substance Use Topics   Alcohol use: Yes    Comment: less than monthly   Drug use: Not Currently     Allergies   Other   Review of Systems Review of Systems  Constitutional:  Positive for fever. Negative for chills.  HENT:  Positive for congestion, rhinorrhea and sore throat. Negative for ear pain.   Respiratory:  Positive for cough. Negative for shortness  of breath.   Gastrointestinal:  Negative for diarrhea and vomiting.     Physical Exam Triage Vital Signs ED Triage Vitals  Encounter Vitals Group     BP 07/20/24 1024 136/87     Girls Systolic BP Percentile --      Girls Diastolic BP Percentile --      Boys Systolic BP Percentile --      Boys Diastolic BP Percentile --      Pulse Rate 07/20/24 1024 68     Resp 07/20/24 1024 18     Temp 07/20/24 1024 98 F (36.7 C)     Temp src --      SpO2 07/20/24 1024 99 %     Weight --      Height --      Head Circumference --      Peak Flow --      Pain Score 07/20/24 1022 7     Pain Loc --      Pain Education --      Exclude from Growth Chart --    No data found.  Updated Vital Signs BP 136/87   Pulse 68   Temp 98 F (36.7 C)   Resp 18   SpO2 99%   Visual Acuity Right Eye Distance:   Left Eye Distance:   Bilateral Distance:    Right  Eye Near:   Left Eye Near:    Bilateral Near:     Physical Exam Constitutional:      General: He is not in acute distress. HENT:     Right Ear: Tympanic membrane normal.     Left Ear: Tympanic membrane normal.     Nose: Congestion and rhinorrhea present.     Mouth/Throat:     Mouth: Mucous membranes are moist.     Pharynx: Oropharynx is clear.  Cardiovascular:     Rate and Rhythm: Normal rate and regular rhythm.     Heart sounds: Normal heart sounds.  Pulmonary:     Effort: Pulmonary effort is normal. No respiratory distress.     Breath sounds: Normal breath sounds.  Neurological:     Mental Status: He is alert.      UC Treatments / Results  Labs (all labs ordered are listed, but only abnormal results are displayed) Labs Reviewed  POC SOFIA SARS ANTIGEN FIA - Normal  POCT RAPID STREP A (OFFICE)    EKG   Radiology No results found.  Procedures Procedures (including critical care time)  Medications Ordered in UC Medications - No data to display  Initial Impression / Assessment and Plan / UC Course  I have  reviewed the triage vital signs and the nursing notes.  Pertinent labs & imaging results that were available during my care of the patient were reviewed by me and considered in my medical decision making (see chart for details).   Viral URI.  Rapid COVID and strep negative.  Discussed symptomatic treatment including Tylenol  or ibuprofen  as needed for fever or discomfort, plain Mucinex as needed for congestion, rest, hydration.  Instructed patient to follow-up with his PCP if not improving.  ED precautions given.  Patient agrees to plan of care.    Final Clinical Impressions(s) / UC Diagnoses   Final diagnoses:  Viral URI     Discharge Instructions      The COVID and strep tests are negative.   Take Tylenol  or ibuprofen  as needed for fever or discomfort.  Take plain Mucinex as needed for congestion.  Rest and keep yourself hydrated.    Follow-up with your primary care provider if your symptoms are not improving.         ED Prescriptions   None    PDMP not reviewed this encounter.   Corlis Burnard DEL, NP 07/20/24 1056

## 2024-07-20 NOTE — Discharge Instructions (Addendum)
 The COVID and strep tests are negative.   Take Tylenol  or ibuprofen  as needed for fever or discomfort.  Take plain Mucinex as needed for congestion.  Rest and keep yourself hydrated.    Follow-up with your primary care provider if your symptoms are not improving.

## 2024-07-20 NOTE — ED Triage Notes (Signed)
 Patient to Urgent Care with complaints of sore throat/ fevers (unsure of max temp)/ runny nose/ cough.   Symptoms x3 days. Feeling worse today.   Taking theraflu/ nyquil.

## 2024-07-28 ENCOUNTER — Encounter: Payer: Self-pay | Admitting: Emergency Medicine

## 2024-07-28 ENCOUNTER — Ambulatory Visit
Admission: EM | Admit: 2024-07-28 | Discharge: 2024-07-28 | Disposition: A | Discharge status: Home / Self Care | Attending: Emergency Medicine | Admitting: Emergency Medicine

## 2024-07-28 DIAGNOSIS — R350 Frequency of micturition: Secondary | ICD-10-CM | POA: Diagnosis present

## 2024-07-28 LAB — POCT URINE DIPSTICK
Bilirubin, UA: NEGATIVE
Blood, UA: NEGATIVE
Glucose, UA: NEGATIVE mg/dL
Ketones, POC UA: NEGATIVE mg/dL
Nitrite, UA: NEGATIVE
POC PROTEIN,UA: NEGATIVE
Spec Grav, UA: >=1.03 — AB (ref 1.010–1.025)
Urobilinogen, UA: 0.2 U/dL
pH, UA: 5.5 (ref 5.0–8.0)

## 2024-07-28 MED ORDER — CEPHALEXIN 250 MG/5ML PO SUSR: 500.0000 mg | Freq: Two times a day (BID) | ORAL | 0 refills | Status: AC

## 2024-07-28 NOTE — ED Triage Notes (Signed)
 Patient complains of pressure and urinary frequency x 3 days. Patient has no taken anything for symptoms. Denies penile discharge and pain.

## 2024-07-28 NOTE — Discharge Instructions (Addendum)
 Your urinalysis shows Charmelle Soh blood cells but does not show bacteria your urine will be sent to the lab to determine exactly which bacteria is present, if any changes need to be made to your medications you will be notified  Begin use of keflex  twice daily for 7 days as you are symptomatic  Penile swab checking for gonorrhea chlamydia and trichomoniasis is pending 2 to 3 days and you will be notified of positive test results only and treatment sent at time of notification  If there is no bacterial growth on your urine swab and all penile swab testing is negative and you continue to have symptoms please schedule a follow-up appointment with the urologist or your primary doctor for reevaluation  You may use over-the-counter Pyridium to help minimize your symptoms until antibiotic removes bacteria, this medication will turn your urine orange  Increase your fluid intake through use of water 

## 2024-07-28 NOTE — ED Provider Notes (Signed)
 Jay Webb    CSN: [id number] Arrival date & time: 07/28/24  9070      History   Chief Complaint Chief Complaint  Patient presents with   Urinary Frequency    HPI Jay Webb is a 23 y.o. male.   Patient presents for evaluation of pelvic pressure, urinary frequency and incomplete bladder emptying present for 2 days.  Has not attempted treatment.  Sexually active but no known exposure.  Does endorse unprotected anal sex with male partner prior to symptoms beginning.  Denies hematuria, abdominal or flank pain, penile discharge or penile or testicle swelling.  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   Hematuria 04/22/2021   Chronic recurrent pilonidal cyst without abscess 07/30/2020   Viral URI with cough 09/07/2019   ADHD (attention deficit hyperactivity disorder), inattentive type 06/07/2019   Heart murmur 06/07/2019   Atypical chest pain 06/07/2019    Past Surgical History:  Procedure Laterality Date   PILONIDAL CYST EXCISION N/A 08/26/2022   Procedure: CYST EXCISION PILONIDAL EXTENSIVE;  Surgeon: Jay Laneta FALCON, MD;  Location: [facility] ORS;  Service: General;  Laterality: N/A;   VARICOCELE EXCISION         Home Medications    Prior to Admission medications   Medication Sig Start Date End Date Taking? Authorizing Provider  cephALEXin  (KEFLEX ) 250 MG/5ML suspension Take 10 mLs (500 mg total) by mouth 2 (two) times daily for 7 days. 07/28/24 08/04/24 Yes Jay Webb, Shelba SAUNDERS, NP    Family History Family History  Problem Relation Age of Onset   Arthritis Mother    Hypertension Mother    Thyroid  disease Mother    Stroke Maternal Grandfather 47    Social History Social History   Tobacco Use   Smoking status: Never   Smokeless tobacco: Never  Vaping Use   Vaping status: Never Used  Substance Use Topics   Alcohol use: Yes    Comment: less than monthly    Drug use: Not Currently     Allergies   Other   Review of Systems Review of Systems   Physical Exam Triage Vital Signs ED Triage Vitals  Encounter Vitals Group     BP 07/28/24 0956 128/84     Girls Systolic BP Percentile --      Girls Diastolic BP Percentile --      Boys Systolic BP Percentile --      Boys Diastolic BP Percentile --      Pulse Rate 07/28/24 0956 (!) 56     Resp 07/28/24 0956 18     Temp 07/28/24 0956 98.7 F (37.1 C)     Temp Source 07/28/24 0956 Oral     SpO2 07/28/24 0956 98 %     Weight --      Height --      Head Circumference --      Peak Flow --      Pain Score 07/28/24 0951 0     Pain Loc --      Pain Education --      Exclude from Growth Chart --    No data found.  Updated Vital Signs BP 128/84 (BP Location: Right Arm)   Pulse (!) 56   Temp 98.7 F (37.1 C) (Oral)   Resp 18   SpO2 98%   Visual Acuity Right Eye Distance:   Left Eye Distance:  Bilateral Distance:    Right Eye Near:   Left Eye Near:    Bilateral Near:     Physical Exam Constitutional:      Appearance: Normal appearance.  Eyes:     Extraocular Movements: Extraocular movements intact.  Abdominal:     Tenderness: There is no abdominal tenderness. There is no right CVA tenderness, left CVA tenderness or guarding.  Neurological:     Mental Status: He is alert and oriented to person, place, and time.      UC Treatments / Results  Labs (all labs ordered are listed, but only abnormal results are displayed) Labs Reviewed  POCT URINE DIPSTICK - Abnormal; Notable for the following components:      Result Value   Spec Grav, UA >=1.030 (*)    Leukocytes, UA Trace (*)    All other components within normal limits  URINE CULTURE  CYTOLOGY, (ORAL, ANAL, URETHRAL) ANCILLARY ONLY    EKG   Radiology No results found.  Procedures Procedures (including critical care time)  Medications Ordered in UC Medications - No data to display  Initial Impression /  Assessment and Plan / UC Course  I have reviewed the triage vital signs and the nursing notes.  Pertinent labs & imaging results that were available during my care of the patient were reviewed by me and considered in my medical decision making (see chart for details).  Urinary frequency  Urinalysis showed leukocytes but negative for nitrates, discussed findings, sent for culture, STI panel pending, will treat per protocol, advised abstinence until lab results and all treatment is complete, empirically placed on cephalexin , recommended nonpharmacological supportive care of all diagnostic testing negative and patient still symptomatic he has to follow-up with urology, walker referral given Final Clinical Impressions(s) / UC Diagnoses   Final diagnoses:  Urinary frequency     Discharge Instructions      Your urinalysis shows Jay Webb blood cells but does not show bacteria your urine will be sent to the lab to determine exactly which bacteria is present, if any changes need to be made to your medications you will be notified  Begin use of keflex  twice daily for 7 days as you are symptomatic  Penile swab checking for gonorrhea chlamydia and trichomoniasis is pending 2 to 3 days and you will be notified of positive test results only and treatment sent at time of notification  If there is no bacterial growth on your urine swab and all penile swab testing is negative and you continue to have symptoms please schedule a follow-up appointment with the urologist or your primary doctor for reevaluation  You may use over-the-counter Pyridium to help minimize your symptoms until antibiotic removes bacteria, this medication will turn your urine orange  Increase your fluid intake through use of water      ED Prescriptions     Medication Sig Dispense Auth. Provider   cephALEXin  (KEFLEX ) 250 MG/5ML suspension Take 10 mLs (500 mg total) by mouth 2 (two) times daily for 7 days. 140 mL Jay Shelba SAUNDERS,  NP      PDMP not reviewed this encounter.   Jay Shelba SAUNDERS, NP 07/28/24 1011

## 2024-07-29 LAB — URINE CULTURE: Culture: NO GROWTH

## 2024-07-30 ENCOUNTER — Ambulatory Visit (HOSPITAL_COMMUNITY): Payer: Self-pay

## 2024-07-30 LAB — CYTOLOGY, (ORAL, ANAL, URETHRAL) ANCILLARY ONLY
Chlamydia: NEGATIVE
Comment: NEGATIVE
Comment: NEGATIVE
Comment: NORMAL
Neisseria Gonorrhea: NEGATIVE
Trichomonas: NEGATIVE

## 2024-08-16 ENCOUNTER — Encounter

## 2024-08-28 ENCOUNTER — Encounter: Admitting: Family Medicine

## 2024-09-14 ENCOUNTER — Encounter
